# Patient Record
Sex: Female | Born: 1940
Health system: Southern US, Community
[De-identification: ages and names within clinical notes are randomized; demographics above are authoritative.]

## PROBLEM LIST (undated history)

## (undated) DIAGNOSIS — M5416 Radiculopathy, lumbar region: Secondary | ICD-10-CM

## (undated) DIAGNOSIS — E119 Type 2 diabetes mellitus without complications: Secondary | ICD-10-CM

## (undated) DIAGNOSIS — M8580 Other specified disorders of bone density and structure, unspecified site: Secondary | ICD-10-CM

## (undated) DIAGNOSIS — E785 Hyperlipidemia, unspecified: Secondary | ICD-10-CM

## (undated) DIAGNOSIS — I10 Essential (primary) hypertension: Secondary | ICD-10-CM

## (undated) DIAGNOSIS — M797 Fibromyalgia: Secondary | ICD-10-CM

## (undated) DIAGNOSIS — M1990 Unspecified osteoarthritis, unspecified site: Secondary | ICD-10-CM

## (undated) HISTORY — PX: APPENDECTOMY: SHX54

## (undated) HISTORY — DX: Essential (primary) hypertension: I10

## (undated) HISTORY — PX: TUBAL LIGATION: SHX77

## (undated) HISTORY — DX: Hyperlipidemia, unspecified: E78.5

## (undated) HISTORY — PX: HAND SURGERY: SHX662

## (undated) HISTORY — DX: Fibromyalgia: M79.7

## (undated) HISTORY — DX: Unspecified osteoarthritis, unspecified site: M19.90

## (undated) HISTORY — DX: Radiculopathy, lumbar region: M54.16

## (undated) HISTORY — DX: Other specified disorders of bone density and structure, unspecified site: M85.80

## (undated) HISTORY — PX: CATARACT EXTRACTION: SUR2

## (undated) HISTORY — PX: OTHER SURGICAL HISTORY: SHX169

## (undated) HISTORY — DX: Type 2 diabetes mellitus without complications: E11.9

## (undated) HISTORY — PX: BREAST ENHANCEMENT SURGERY: SHX7

## (undated) HISTORY — PX: PLANTAR FASCIA SURGERY: SHX746

---

## 2008-05-08 ENCOUNTER — Encounter: Admission: RE | Admit: 2008-05-08 | Discharge: 2008-08-06 | Payer: Self-pay | Admitting: Neurology

## 2008-05-10 ENCOUNTER — Encounter: Admission: RE | Admit: 2008-05-10 | Discharge: 2008-05-10 | Payer: Self-pay | Admitting: Neurology

## 2009-03-28 ENCOUNTER — Ambulatory Visit (HOSPITAL_BASED_OUTPATIENT_CLINIC_OR_DEPARTMENT_OTHER): Admission: RE | Admit: 2009-03-28 | Discharge: 2009-03-28 | Payer: Self-pay | Admitting: Orthopaedic Surgery

## 2009-09-15 IMAGING — CT CT ANGIO HEAD
1 of 10 series · 5 of 33 positions shown · IV contrast ([ID] [id number])
Comparison: None

CTA neck

CLINICAL DATA: Knee dizziness.  The worse when lying down.
Possible labyrinthitis.

CT ANGIOGRAPHY HEAD AND NECK
TECHNIQUE: Multidetector CT imaging of the head and neck was
performed using the standard protocol prior to and during bolus
administration of intravenous contrast. Multiplanar CT image
reconstructions including MIPs were obtained to evaluate the
vascular anatomy.  Carotid stenosis measurements (when applicable)
are obtained utilizing NASCET criteria, using the distal internal
carotid diameter as the denominator.
Contrast: 100 ml [id number]-P33

[Series 404: axial · axial · 0.33mm/px · z∈[-187,+40]mm · 5 of 343 slices shown]
[im 58/343  soft-tissue]
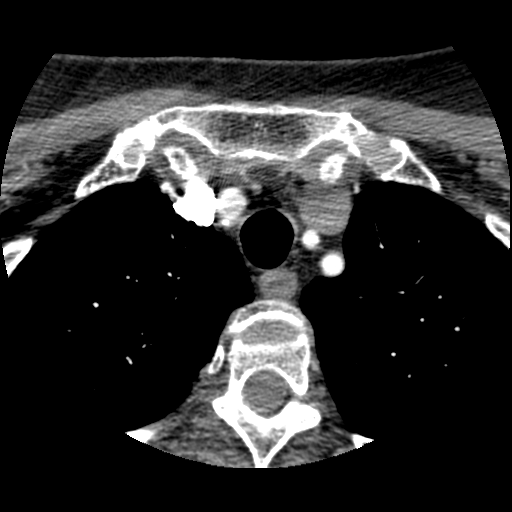
[im 115/343  bone]
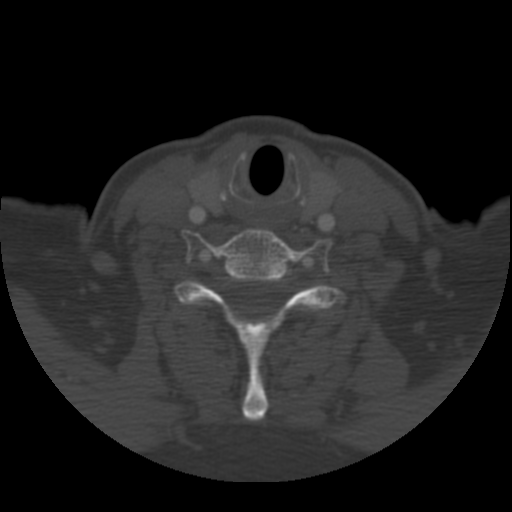
[im 172/343  soft-tissue]
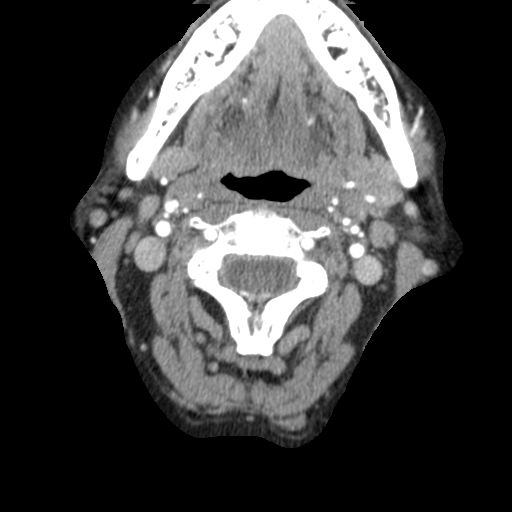
[im 229/343  bone]
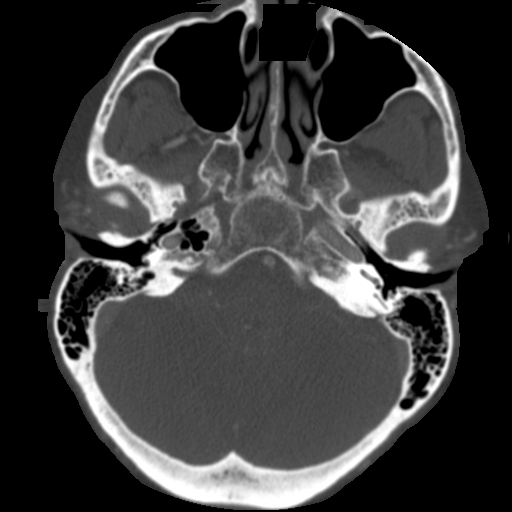
[im 286/343  soft-tissue]
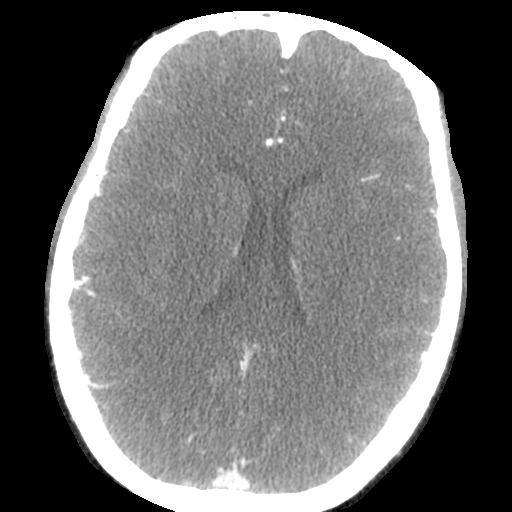

[5 of 33 positions shown; findings below may reference images not displayed]

FINDINGS: Lung apices are clear.  No superior mediastinal
pathology.

There is atherosclerosis of the aorta with calcification.  No
origin stenoses.  Innominate and left subclavian arteries appear
normal proximally.  There is atherosclerotic plaquing of the
proximal left subclavian artery which narrows the lumen from 11 mm
to 6 mm.  This is consistent with a diameter stenosis of 40-50%.

The left vertebral artery is the dominant vertebral artery.  There
is mild atherosclerotic narrowing at its origin but no more than
20% diameter stenosis.  The vessel appears widely patent through
the neck beyond that.  The right vertebral artery is also a fairly
large vessel which appears widely patent.  The origin is difficult
to evaluate accurately because of artifact in that area from dens
venous contrast.  I do not suspect that there is origin stenosis on
this side.

The right common carotid arteries widely patent to the bifurcation.
There is minimal atherosclerotic plaque at the carotid bifurcation
but no narrowing or irregularity.

The left common carotid artery is widely patent to the bifurcation.
The bifurcation appears normal on this side.  Both cervical ICA is
appear normal.
IMPRESSION: 40-50% stenosis of the proximal left subclavian artery.

Approximate 20% narrowing of the left vertebral artery origin.  No
flow-limiting stenosis.  The vessel appears normal beyond that.  No
right vertebral pathology is identified, though the origin is
difficult to evaluate accurately because of artifact related to
dense venous contrast.

No carotid bifurcation disease on the left.  Minimal plaque at the
carotid bifurcation on the right without stenosis.

CTA head
FINDINGS: Both internal carotid arteries are widely patent into
the brain.  There is some atherosclerotic change in the carotid
siphon regions but no apparent stenoses.  Both anterior and middle
cerebral vessels appear normal without proximal stenosis, aneurysm
or vascular malformation.  More distal branch vessels appear
unremarkable.

Both vertebral arteries are patent to the basilar.  The distal left
vertebral artery is fenestrated but widely patent.  There is no
basilar stenosis.  Posterior circulation branch vessels appear
normal.
IMPRESSION: Ordinary atherosclerosis in the carotid siphon regions without
suspicion of flow-limiting stenosis.  No anterior circulation
pathology beyond that.

No significant posterior circulation finding.  Incidental
fenestrated left vertebral artery at the foramen magnum without
evidence of significant pathology.

## 2010-05-25 LAB — POCT I-STAT, CHEM 8
BUN: 20 mg/dL (ref 6–23)
Creatinine, Ser: 0.8 mg/dL (ref 0.4–1.2)
Hemoglobin: 13.3 g/dL (ref 12.0–15.0)
Potassium: 3.6 mEq/L (ref 3.5–5.1)
Sodium: 142 mEq/L (ref 135–145)

## 2013-04-18 DIAGNOSIS — M546 Pain in thoracic spine: Secondary | ICD-10-CM

## 2013-04-18 DIAGNOSIS — M47814 Spondylosis without myelopathy or radiculopathy, thoracic region: Secondary | ICD-10-CM

## 2013-04-18 DIAGNOSIS — M797 Fibromyalgia: Secondary | ICD-10-CM | POA: Insufficient documentation

## 2013-04-18 DIAGNOSIS — M5414 Radiculopathy, thoracic region: Secondary | ICD-10-CM | POA: Insufficient documentation

## 2013-04-18 DIAGNOSIS — G543 Thoracic root disorders, not elsewhere classified: Secondary | ICD-10-CM | POA: Insufficient documentation

## 2013-04-18 DIAGNOSIS — M5417 Radiculopathy, lumbosacral region: Secondary | ICD-10-CM

## 2013-04-18 DIAGNOSIS — M1990 Unspecified osteoarthritis, unspecified site: Secondary | ICD-10-CM | POA: Insufficient documentation

## 2013-04-18 DIAGNOSIS — M791 Myalgia, unspecified site: Secondary | ICD-10-CM | POA: Insufficient documentation

## 2013-04-18 HISTORY — DX: Pain in thoracic spine: M54.6

## 2013-04-18 HISTORY — DX: Thoracic root disorders, not elsewhere classified: G54.3

## 2013-04-18 HISTORY — DX: Spondylosis without myelopathy or radiculopathy, thoracic region: M47.814

## 2013-04-18 HISTORY — DX: Myalgia, unspecified site: M79.10

## 2013-04-18 HISTORY — DX: Radiculopathy, thoracic region: M54.14

## 2014-12-24 ENCOUNTER — Encounter: Payer: Self-pay | Admitting: Podiatry

## 2014-12-24 ENCOUNTER — Ambulatory Visit (INDEPENDENT_AMBULATORY_CARE_PROVIDER_SITE_OTHER): Payer: Medicare Other | Admitting: Podiatry

## 2014-12-24 ENCOUNTER — Ambulatory Visit (INDEPENDENT_AMBULATORY_CARE_PROVIDER_SITE_OTHER): Payer: Medicare Other

## 2014-12-24 DIAGNOSIS — M2020 Hallux rigidus, unspecified foot: Secondary | ICD-10-CM

## 2014-12-24 DIAGNOSIS — R52 Pain, unspecified: Secondary | ICD-10-CM

## 2014-12-24 DIAGNOSIS — M205X9 Other deformities of toe(s) (acquired), unspecified foot: Secondary | ICD-10-CM | POA: Diagnosis not present

## 2014-12-24 DIAGNOSIS — M722 Plantar fascial fibromatosis: Secondary | ICD-10-CM | POA: Diagnosis not present

## 2014-12-24 NOTE — Progress Notes (Signed)
   Subjective:    Patient ID: Stephanie Macdonald, female    DOB: 1940-06-09, 74 y.o.   MRN: [id number]  HPIThis patient presents to the office with right foot pain in her heel.  She says she was seen in [place] when the pain started but it has become much more painful.  She says she was away this weekend and she experienced severe pain upon rising all weekend.  She has good shoes with shoe sockliner.  She has no history of trauma or injury.    Patient presents here today with right heel pain since started again 2-3 weeks ago.  Review of Systems  All other systems reviewed and are negative.      Objective:   Physical Exam GENERAL APPEARANCE: Alert, conversant. Appropriately groomed. No acute distress.  VASCULAR: Pedal pulses palpable at  [employer] and PT bilateral.  Capillary refill time is immediate to all digits,  Normal temperature gradient.  Digital hair growth is present bilateral  NEUROLOGIC: sensation is normal to 5.07 monofilament at 5/5 sites bilateral.  Light touch is intact bilateral, Muscle strength normal.  MUSCULOSKELETAL: acceptable muscle strength, tone and stability bilateral.  Intrinsic muscluature intact bilateral.  Rectus appearance of foot and digits noted bilateral. Palpable pain at the insertion plantar fascia that extends through arch right foot.  There is hallux limitus 1st MPJ B/L.  DERMATOLOGIC: skin color, texture, and turgor are within normal limits.  No preulcerative lesions or ulcers  are seen, no interdigital maceration noted.  No open lesions present.  Digital nails are asymptomatic. No drainage noted.         Assessment & Plan:  Plantar fascitis right foot  Hallux limitus B/l  ROV injection therapy purestrides  RTC prn.

## 2015-03-18 ENCOUNTER — Ambulatory Visit: Payer: Medicare Other | Admitting: Podiatry

## 2015-03-27 ENCOUNTER — Ambulatory Visit (INDEPENDENT_AMBULATORY_CARE_PROVIDER_SITE_OTHER): Payer: Medicare Other | Admitting: Sports Medicine

## 2015-03-27 ENCOUNTER — Encounter: Payer: Self-pay | Admitting: Sports Medicine

## 2015-03-27 DIAGNOSIS — M7661 Achilles tendinitis, right leg: Secondary | ICD-10-CM

## 2015-03-27 DIAGNOSIS — M545 Low back pain, unspecified: Secondary | ICD-10-CM

## 2015-03-27 DIAGNOSIS — M205X9 Other deformities of toe(s) (acquired), unspecified foot: Secondary | ICD-10-CM

## 2015-03-27 DIAGNOSIS — M79673 Pain in unspecified foot: Secondary | ICD-10-CM | POA: Diagnosis not present

## 2015-03-27 DIAGNOSIS — M722 Plantar fascial fibromatosis: Secondary | ICD-10-CM

## 2015-03-27 DIAGNOSIS — M2020 Hallux rigidus, unspecified foot: Secondary | ICD-10-CM

## 2015-03-27 NOTE — Progress Notes (Signed)
Patient ID: Stephanie Macdonald, female   DOB: March 07, 1941, 74 y.o.   MRN: [id number] Subjective: Stephanie Macdonald is a 75 y.o. female patient returns to office with complaint of heel pain on the right. Patient admits to post static dyskinesia for several months in duration. Patient has treated this problem with 1 injection by Dr. Prudence Davidson, inserts, changing shoes, and occasional stretching with no relief in symptoms. Patient reports that this treatment has not worked. Admits that she is also getting pain at back of the heel that is becoming more bothersome. Patient denies any other pedal complaints at this time.   There are no active problems to display for this patient.  Current Outpatient Prescriptions on File Prior to Visit  Medication Sig Dispense Refill  . Calcium Carb-Cholecalciferol (CALTRATE 600+D3 SOFT) 600-800 MG-UNIT CHEW once daily.    Marland Kitchen gabapentin (NEURONTIN) 600 MG tablet TK 1 AND 1/2 TS PO TID  0  . losartan (COZAAR) 100 MG tablet 100 mg.    . meloxicam (MOBIC) 15 MG tablet TK 1 T PO QAM  2  . Olopatadine HCl (PATADAY) 0.2 % SOLN Apply to eye.    . polyethylene glycol (MIRALAX / GLYCOLAX) packet daily as needed.    . psyllium (TGT PSYLLIUM FIBER) 0.52 G capsule 28.3 %.    . rosuvastatin (CRESTOR) 5 MG tablet 5 mg.     No current facility-administered medications on file prior to visit.   Allergies  Allergen Reactions  . Codeine     Other reaction(s): VOMITING  . Cymbalta [Duloxetine Hcl]   . Duloxetine   . Hydrocodone-Acetaminophen   . Metaxalone   . Other   . Tramadol   . Venlafaxine     Objective: Physical Exam General: The patient is alert and oriented x3 in no acute distress.  Dermatology: Skin is warm, dry and supple bilateral lower extremities. Nails 1-10 are normal. There is no erythema, edema, no eccymosis, no open lesions present. Integument is otherwise unremarkable.  Vascular: Dorsalis Pedis pulse 2/4 and Posterior Tibial pulse are 1/4 bilateral. Capillary fill  time is immediate to all digits.  Neurological: Grossly intact to light touch with an achilles reflex of +2/5 and a  negative Tinel's sign bilateral.  Musculoskeletal: Tenderness to palpation at the medial calcaneal tubercale and through the insertion of the plantar fascia on the right>left foot and at the insertion of the achilles on right. There is no gap or dell at achilles, feel intact. No pain with compression of calcaneus bilateral. No pain with tuning fork to calcaneus bilateral. No pain with calf compression bilateral. There is mild decreased Ankle joint and 1st MTPJs range of motion bilateral. All other joints range of motion within normal limits bilateral. Strength 5/5 in all groups bilateral.   Assessment and Plan: Problem List Items Addressed This Visit    None    Visit Diagnoses    Plantar fasciitis of right foot    -  Primary    Tendonitis, Achilles, right        Foot pain, unspecified laterality        Hallux limitus, acquired, unspecified laterality        Back pain at L4-L5 level          -Complete examination performed. Discussed with patient in detail the condition of achilles tendonitis and plantar fasciitis, how this occurs and general treatment options. Explained both conservative and surgical treatments.  -Declined repeat injection today -Patient has tried oral meds and did not help; does not  want to go back on another course -Recommended good supportive shoes and advised use of OTC insert. -Explained in detail the use of the night splint which was dispensed at today's visit. -Explained and dispensed to patient daily stretching exercises. Rx sent for PT at [facility] with treatment modalities.  -Recommend patient to ice affected area 1-2x daily. Ice pack dispensed.  -Patient to return to office in 4 weeks for follow up or sooner if problems or questions arise.  Landis Martins, DPM

## 2015-04-24 ENCOUNTER — Ambulatory Visit (INDEPENDENT_AMBULATORY_CARE_PROVIDER_SITE_OTHER): Payer: Medicare Other | Admitting: Sports Medicine

## 2015-04-24 ENCOUNTER — Encounter: Payer: Self-pay | Admitting: Sports Medicine

## 2015-04-24 DIAGNOSIS — M7661 Achilles tendinitis, right leg: Secondary | ICD-10-CM | POA: Diagnosis not present

## 2015-04-24 DIAGNOSIS — M779 Enthesopathy, unspecified: Secondary | ICD-10-CM

## 2015-04-24 DIAGNOSIS — M79673 Pain in unspecified foot: Secondary | ICD-10-CM

## 2015-04-24 DIAGNOSIS — M722 Plantar fascial fibromatosis: Secondary | ICD-10-CM | POA: Diagnosis not present

## 2015-04-24 NOTE — Progress Notes (Signed)
Patient ID: Stephanie Macdonald, female   DOB: 04/30/1940, 75 y.o.   MRN: [id number]  Subjective: Stephanie Macdonald is a 75 y.o. female patient returns to office for follow up eval of heel pain on the right and now new pain across top of right ankle started about 1 week ago with PT; feels like her ankle is "sprained". Patient states that she feels a little better at the heel area but its not back to her being able to walk comfortable like she wants to. Patient denies any other pedal complaints at this time.   There are no active problems to display for this patient.  Current Outpatient Prescriptions on File Prior to Visit  Medication Sig Dispense Refill  . Calcium Carb-Cholecalciferol (CALTRATE 600+D3 SOFT) 600-800 MG-UNIT CHEW once daily.    Marland Kitchen gabapentin (NEURONTIN) 600 MG tablet TK 1 AND 1/2 TS PO TID  0  . losartan (COZAAR) 100 MG tablet 100 mg.    . meloxicam (MOBIC) 15 MG tablet TK 1 T PO QAM  2  . Olopatadine HCl (PATADAY) 0.2 % SOLN Apply to eye.    . polyethylene glycol (MIRALAX / GLYCOLAX) packet daily as needed.    . psyllium (TGT PSYLLIUM FIBER) 0.52 G capsule 28.3 %.    . rosuvastatin (CRESTOR) 5 MG tablet 5 mg.     No current facility-administered medications on file prior to visit.   Allergies  Allergen Reactions  . Codeine     Other reaction(s): VOMITING  . Cymbalta [Duloxetine Hcl]   . Duloxetine   . Hydrocodone-Acetaminophen   . Metaxalone   . Other   . Tramadol   . Venlafaxine     Objective: Physical Exam General: The patient is alert and oriented x3 in no acute distress.  Dermatology: Skin is warm, dry and supple bilateral lower extremities. Nails 1-10 are normal. There is no erythema, edema, no eccymosis, no open lesions present. Integument is otherwise unremarkable.  Vascular: Dorsalis Pedis pulse 2/4 and Posterior Tibial pulse are 1/4 bilateral. Capillary fill time is immediate to all digits.  Neurological: Grossly intact to light touch with an achilles reflex  of +2/5 and a  negative Tinel's sign bilateral.  Musculoskeletal: Tenderness to palpation at the medial calcaneal tubercale and through the insertion of the plantar fascia on the right>left foot and at the insertion of the achilles on right. There is also mild pain to anterior ankle on right with focal soft tissue swelling. There is no gap or dell at anterior ankle or achilles, feels intact. No pain with compression of calcaneus bilateral. No pain with tuning fork to calcaneus bilateral. No pain with calf compression bilateral. There is mild decreased Ankle joint and 1st MTPJs range of motion bilateral. All other joints range of motion within normal limits bilateral. Strength 5/5 in all groups bilateral.   Assessment and Plan: Problem List Items Addressed This Visit    None    Visit Diagnoses    Plantar fasciitis of right foot    -  Primary    Tendonitis, Achilles, right        Tendonitis        Anterior ankle on right, likely compensation from PT    Foot pain, unspecified laterality          -Complete examination performed. Discussed with patient in detail the condition of achilles tendonitis, plantar fasciitis, and new anterior ankle pain which is likely compensation secondary to increased mobility with PT. -Declined repeat injection today -Patient has tried oral  meds and did not help; does not want to go back on another course at this time -Recommended good supportive shoes and advised use of OTC insert. Patient states that she stopped using inserts because they made her feet feel worse.  -Rx Fascial brace for right foot an instructed on use -Continue with night splint with modification of using 1 hour before bed -Continue with daily stretching exercises and PT at [facility] with treatment modalities.  -Recommend patient to ice affected areas 1-2x daily. Ice pack dispensed.  -Patient to return to office in 3-4 weeks for follow up or sooner if problems or questions arise.  Landis Martins,  DPM

## 2015-05-15 ENCOUNTER — Encounter: Payer: Self-pay | Admitting: Sports Medicine

## 2015-05-15 ENCOUNTER — Ambulatory Visit (INDEPENDENT_AMBULATORY_CARE_PROVIDER_SITE_OTHER): Payer: Medicare Other | Admitting: Sports Medicine

## 2015-05-15 DIAGNOSIS — M7661 Achilles tendinitis, right leg: Secondary | ICD-10-CM

## 2015-05-15 DIAGNOSIS — M779 Enthesopathy, unspecified: Secondary | ICD-10-CM

## 2015-05-15 DIAGNOSIS — M79673 Pain in unspecified foot: Secondary | ICD-10-CM

## 2015-05-15 DIAGNOSIS — M722 Plantar fascial fibromatosis: Secondary | ICD-10-CM | POA: Diagnosis not present

## 2015-05-15 MED ORDER — TRIAMCINOLONE ACETONIDE 10 MG/ML IJ SUSP: 10.0000 mg | Freq: Once | INTRAMUSCULAR | Status: AC

## 2015-05-15 NOTE — Progress Notes (Signed)
Patient ID: Stephanie Macdonald, female   DOB: 1940/03/12, 75 y.o.   MRN: [id number]  Subjective: Stephanie Macdonald is a 75 y.o. female patient returns to office for follow up eval of heel pain on the right states that pain is better to touch on right heel but still has pain with first standing 4-5/10 sometimes but today 2-3/10. States that pain on top of her ankle is better and pain at the back of her heel is fine; she only has pain now on bottom. Patient denies any other pedal complaints at this time.   There are no active problems to display for this patient.  Current Outpatient Prescriptions on File Prior to Visit  Medication Sig Dispense Refill  . Calcium Carb-Cholecalciferol (CALTRATE 600+D3 SOFT) 600-800 MG-UNIT CHEW once daily.    Marland Kitchen gabapentin (NEURONTIN) 600 MG tablet TK 1 AND 1/2 TS PO TID  0  . losartan (COZAAR) 100 MG tablet 100 mg.    . meloxicam (MOBIC) 15 MG tablet TK 1 T PO QAM  2  . Olopatadine HCl (PATADAY) 0.2 % SOLN Apply to eye.    . polyethylene glycol (MIRALAX / GLYCOLAX) packet daily as needed.    . psyllium (TGT PSYLLIUM FIBER) 0.52 G capsule 28.3 %.    . rosuvastatin (CRESTOR) 5 MG tablet 5 mg.     No current facility-administered medications on file prior to visit.   Allergies  Allergen Reactions  . Codeine     Other reaction(s): VOMITING  . Cymbalta [Duloxetine Hcl]   . Duloxetine   . Hydrocodone-Acetaminophen   . Metaxalone   . Other   . Tramadol   . Venlafaxine     Objective: Physical Exam General: The patient is alert and oriented x3 in no acute distress.  Dermatology: Skin is warm, dry and supple bilateral lower extremities. Nails 1-10 are normal. There is no erythema, edema, no eccymosis, no open lesions present. Integument is otherwise unremarkable.  Vascular: Dorsalis Pedis pulse 2/4 and Posterior Tibial pulse are 1/4 bilateral. Capillary fill time is immediate to all digits.  Neurological: Grossly intact to light touch with an achilles reflex of  +2/5 and a  negative Tinel's sign bilateral.  Musculoskeletal: Tenderness to palpation at the medial calcaneal tubercale and through the insertion of the plantar fascia on the rightfoot. No pain at the insertion of the achilles on right. There is decreased pain to anterior ankle on right There is no instability, gap or dell at anterior ankle or achilles, feels intact. No pain with compression of calcaneus bilateral. No pain with tuning fork to calcaneus bilateral. No pain with calf compression bilateral. There is mild decreased Ankle joint and 1st MTPJs range of motion bilateral. All other joints range of motion within normal limits bilateral. Strength 5/5 in all groups bilateral.   Assessment and Plan: Problem List Items Addressed This Visit    None    Visit Diagnoses    Plantar fasciitis of right foot    -  Primary    Relevant Medications    triamcinolone acetonide (KENALOG) 10 MG/ML injection 10 mg    Tendonitis, Achilles, right        Resolved    Tendonitis        Resolved    Foot pain, unspecified laterality          -Complete examination performed. Discussed with patient in detail the condition of continued plantar fasciitis - I encouraged patient to consider repeat injection to the plantar fascial area of which patient  elected to after in depth discussion of the benefits of steroid to help with allowing the inflammation to diminish and help the fascia to heal at this area. After oral consent and aseptic prep, injected a mixture containing 1 ml of 2%  plain lidocaine, 1 ml 0.5% plain marcaine, 0.5 ml of kenalog 10 and 0.5 ml of dexamethasone phosphate into right heel. Post-injection care discussed with patient.  -Patient has tried oral meds and state they did not help; does not want to go back on another course at this time -Recommended good supportive shoes and advised use of OTC insert. Patient states that she stopped using inserts because they made her feet feel worse and is only  using heel cups -Cont with Fascial brace for right foot as instructed;  Patient did not have a fascial brace today I am convinced that patient is not using all treatment modalities entirely -Continue with night splint with modification of using 1 hour before bed -Patient has completed PT with functional improvement as noted on PT notes -Recommend patient to ice affected areas 1-2x daily.  -Patient to return to office in 2 weeks for follow up or sooner if problems or questions arise.  Will recommend MRI if patient is not better at next visit for further evaluation.   Landis Martins, DPM

## 2015-05-29 ENCOUNTER — Ambulatory Visit: Payer: Medicare Other | Admitting: Sports Medicine

## 2015-05-29 ENCOUNTER — Encounter: Payer: Self-pay | Admitting: Sports Medicine

## 2015-05-29 ENCOUNTER — Ambulatory Visit (INDEPENDENT_AMBULATORY_CARE_PROVIDER_SITE_OTHER): Payer: Medicare Other | Admitting: Sports Medicine

## 2015-05-29 DIAGNOSIS — M779 Enthesopathy, unspecified: Secondary | ICD-10-CM | POA: Diagnosis not present

## 2015-05-29 DIAGNOSIS — M722 Plantar fascial fibromatosis: Secondary | ICD-10-CM

## 2015-05-29 DIAGNOSIS — M79671 Pain in right foot: Secondary | ICD-10-CM

## 2015-05-29 NOTE — Progress Notes (Signed)
Patient ID: Stephanie Macdonald, female   DOB: Feb 24, 1941, 75 y.o.   MRN: [id number]  Subjective: Stephanie Macdonald is a 75 y.o. female patient returns to office for follow up eval of heel pain on the right states that pain is better to touch on right heel but still has pain with first standing, after the injection the pain was completely gone for about 1 weeks but then slowly started to have pain again with first steps out of bed. Admits that she hasn't be wearing fascial brace because she thought it was for the pain that was at the top of her ankle. Patient denies any other pedal complaints at this time.   Patient Active Problem List   Diagnosis Date Noted  . Arthritis 04/18/2013  . Fibromyalgia 04/18/2013  . Muscle ache 04/18/2013  . Pain in thoracic spine 04/18/2013  . 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 Lesion, thoracic root 04/18/2013  . Degenerative arthritis of thoracic spine 04/18/2013   Current Outpatient Prescriptions on File Prior to Visit  Medication Sig Dispense Refill  . Calcium Carb-Cholecalciferol (CALTRATE 600+D3 SOFT) 600-800 MG-UNIT CHEW once daily.    Marland Kitchen gabapentin (NEURONTIN) 600 MG tablet TK 1 AND 1/2 TS PO TID  0  . losartan (COZAAR) 100 MG tablet 100 mg.    . meloxicam (MOBIC) 15 MG tablet TK 1 T PO QAM  2  . Olopatadine HCl (PATADAY) 0.2 % SOLN Apply to eye.    . polyethylene glycol (MIRALAX / GLYCOLAX) packet daily as needed.    . psyllium (TGT PSYLLIUM FIBER) 0.52 G capsule 28.3 %.    . rosuvastatin (CRESTOR) 5 MG tablet 5 mg.     Current Facility-Administered Medications on File Prior to Visit  Medication Dose Route Frequency Provider Last Rate Last Dose  . triamcinolone acetonide (KENALOG) 10 MG/ML injection 10 mg  10 mg Other Once Landis Martins, DPM       Allergies  Allergen Reactions  . Codeine     Other reaction(s): VOMITING  . Cymbalta [Duloxetine Hcl]   . Duloxetine   . Hydrocodone-Acetaminophen   . Metaxalone   . Other   . Tramadol   .  Venlafaxine     Objective: Physical Exam General: The patient is alert and oriented x3 in no acute distress.  Dermatology: Skin is warm, dry and supple bilateral lower extremities. Nails 1-10 are normal. There is no erythema, edema, no eccymosis, no open lesions present. Integument is otherwise unremarkable.  Vascular: Dorsalis Pedis pulse 2/4 and Posterior Tibial pulse are 1/4 bilateral. Capillary fill time is immediate to all digits.  Neurological: Grossly intact to light touch with an achilles reflex of +2/5 and a  negative Tinel's sign bilateral.  Musculoskeletal: No Tenderness to palpation at the medial calcaneal tubercale and through the insertion of the plantar fascia on the rightfoot. No pain at the insertion of the achilles on right. There is no pain to anterior ankle on right There is no instability, gap or dell at anterior ankle or achilles, feels intact. No pain with compression of calcaneus bilateral. No pain with tuning fork to calcaneus bilateral. No pain with calf compression bilateral. Subjective pain at plantar heel with standing after period of sitting in the morning only. There is mild decreased Ankle joint and 1st MTPJs range of motion bilateral. All other joints range of motion within normal limits bilateral. Strength 5/5 in all groups bilateral.   Assessment and Plan: Problem List Items Addressed This Visit    None  Visit Diagnoses    Plantar fasciitis of right foot    -  Primary    Tendonitis        Right foot pain          -Complete examination performed. Discussed with patient in detail the condition of continued plantar fasciitis -Patient does not want another injection at this time.  -Patient has tried oral meds and state they did not help; does not want to go back on another course at this time -Recommended good supportive shoes and advised use of OTC inserts/heel cups -Cont with Fascial brace for right foot as instructed;  Patient did not have a fascial  brace today I am convinced that patient is not using all treatment modalities entirely; Educated patient on need to return to using brace as instructed -Continue with night splint with modification of using 1 hour before bed -Recommend patient to ice affected areas 1-2x daily.  -Patient to return to office in 2 weeks for follow up or sooner if problems or questions arise.  Will recommend MRI vs EPAT if patient is not better at next visit.  Landis Martins, DPM

## 2015-06-12 ENCOUNTER — Telehealth: Payer: Self-pay | Admitting: *Deleted

## 2015-06-12 ENCOUNTER — Encounter: Payer: Self-pay | Admitting: Sports Medicine

## 2015-06-12 ENCOUNTER — Ambulatory Visit (INDEPENDENT_AMBULATORY_CARE_PROVIDER_SITE_OTHER): Payer: Medicare Other | Admitting: Sports Medicine

## 2015-06-12 DIAGNOSIS — M79671 Pain in right foot: Secondary | ICD-10-CM

## 2015-06-12 DIAGNOSIS — M722 Plantar fascial fibromatosis: Secondary | ICD-10-CM | POA: Diagnosis not present

## 2015-06-12 DIAGNOSIS — M779 Enthesopathy, unspecified: Secondary | ICD-10-CM | POA: Diagnosis not present

## 2015-06-12 NOTE — Progress Notes (Signed)
Patient ID: Stephanie Macdonald, female   DOB: January 18, 1941, 75 y.o.   MRN: [id number]  Subjective: Stephanie Macdonald is a 75 y.o. female patient returns to office for follow up eval of heel pain on the right states that pain is better to touch on right heel but still has pain with first standing,states that she has been wearing fascial brace and cannot tell a difference of its helping.. Reports that she has been compliant with good shoes and icing. States that she has a little bit of pain at the top of the ankle and back of the right heel. Patient admits that overall pain is better from when she initially presented. However, these areas are still tender at times. Patient denies any other pedal complaints at this time.   Patient Active Problem List   Diagnosis Date Noted  . Arthritis 04/18/2013  . Fibromyalgia 04/18/2013  . Muscle ache 04/18/2013  . Pain in thoracic spine 04/18/2013  . 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 Lesion, thoracic root 04/18/2013  . Degenerative arthritis of thoracic spine 04/18/2013   Current Outpatient Prescriptions on File Prior to Visit  Medication Sig Dispense Refill  . Calcium Carb-Cholecalciferol (CALTRATE 600+D3 SOFT) 600-800 MG-UNIT CHEW once daily.    Marland Kitchen gabapentin (NEURONTIN) 600 MG tablet TK 1 AND 1/2 TS PO TID  0  . losartan (COZAAR) 100 MG tablet 100 mg.    . meloxicam (MOBIC) 15 MG tablet TK 1 T PO QAM  2  . Olopatadine HCl (PATADAY) 0.2 % SOLN Apply to eye.    . polyethylene glycol (MIRALAX / GLYCOLAX) packet daily as needed.    . psyllium (TGT PSYLLIUM FIBER) 0.52 G capsule 28.3 %.    . rosuvastatin (CRESTOR) 5 MG tablet 5 mg.     Current Facility-Administered Medications on File Prior to Visit  Medication Dose Route Frequency Provider Last Rate Last Dose  . triamcinolone acetonide (KENALOG) 10 MG/ML injection 10 mg  10 mg Other Once Landis Martins, DPM       Allergies  Allergen Reactions  . Codeine     Other reaction(s): VOMITING  .  Cymbalta [Duloxetine Hcl]   . Duloxetine   . Hydrocodone-Acetaminophen   . Metaxalone   . Other   . Tramadol   . Venlafaxine     Objective: Physical Exam General: The patient is alert and oriented x3 in no acute distress.  Dermatology: Skin is warm, dry and supple bilateral lower extremities. Nails 1-10 are normal. There is no erythema, edema, no eccymosis, no open lesions present. Integument is otherwise unremarkable.  Vascular: Dorsalis Pedis pulse 2/4 and Posterior Tibial pulse are 1/4 bilateral. Capillary fill time is immediate to all digits.  Neurological: Grossly intact to light touch with an achilles reflex of +2/5 and a  negative Tinel's sign bilateral.  Musculoskeletal: Mild Tenderness to palpation at the medial calcaneal tubercale and through the insertion of the plantar fascia on the right foot. Mild pain at the lateral insertion of the achilles on right. There is mild pain to anterior ankle on right over extensor tendons; likely compensation. There is no instability, gap or dell at anterior ankle or achilles, feels intact on right. No pain with compression of calcaneus bilateral. No pain with tuning fork to calcaneus bilateral. No pain with calf compression bilateral.  There is mild decreased Ankle joint and 1st MTPJs range of motion bilateral. All other joints range of motion within normal limits bilateral. Strength 5/5 in all groups bilateral.   Assessment and  Plan: Problem List Items Addressed This Visit    None    Visit Diagnoses    Plantar fasciitis of right foot    -  Primary    Relevant Orders    CBC with Differential    Basic Metabolic Panel    Sedimentation Rate    C-reactive protein    Rheumatoid factor    Uric Acid    HLA-B27 Antigen    ANA    Tendonitis        Relevant Orders    CBC with Differential    Basic Metabolic Panel    Sedimentation Rate    C-reactive protein    Rheumatoid factor    Uric Acid    HLA-B27 Antigen    ANA    Right foot pain         Relevant Orders    CBC with Differential    Basic Metabolic Panel    Sedimentation Rate    C-reactive protein    Rheumatoid factor    Uric Acid    HLA-B27 Antigen    ANA      -Complete examination performed. Discussed with patient in detail the condition of continued plantar fasciitis -Patient does not want another injection at this time.  -Patient has tried oral meds and state they did not help; does not want to go back on another course at this time -Recommended good supportive shoes and advised use of OTC inserts/heel cups -Cont with Fascial brace for right foot as instructed -Continue with night splint with modification of using 1 hour before bed -Ordered MRI to rule out partial tear and for further evaluation of tendinitis and fasciitis right foot in the setting of minimal improvement/resolution with conservative treatment -Ordered arthritic panel for further evaluation since patient showing minimal improvement to rule out any underlying inflammatory component -Recommend patient to continue to ice affected areas 1-2x daily.  -Patient to return to office after MRI or follow up or sooner if problems or questions arise.  Landis Martins, DPM

## 2015-06-12 NOTE — Telephone Encounter (Addendum)
-----   Message from [place], [place] sent at 06/12/2015 11:31 AM EDT ----- Regarding: MRI Please order MRI w/o contrast right foot and ankle r/o partial tear and evaluate plantar fascia, extensor tendons, and achilles tendon Thanks Dr. Cannon Kettle.  [employer] DOES NOT REQUIRE PRIOR AUTHORIZATION FOR MRI - [id number] AND [id number].  Faxed to [place]. 06/20/2015-Faxed request for MRI results to be sent to the [facility] office [phone] fax.

## 2015-06-13 ENCOUNTER — Telehealth: Payer: Self-pay | Admitting: Sports Medicine

## 2015-06-13 NOTE — Telephone Encounter (Signed)
Patient had lab work from her primary care doctor sent to me via fax from Dr. Hale Bogus office these labs were from 05/01/2015 consisting of a lipid panel CBC hemoglobin A1c and BMP , all which were  Within normal limits. I expressed to the patient that I am concerned for possible inflammatory component and that a arthritic panel will still need to be done. Advised patient to go to [facility] to get blood work drawn. Patient expressed understanding and states that she will try to go to [facility] tomorrow to get the blood work drawn. She also admits that she has been contacted about her MRI and is supposed to go to tomorrow for that. I advised patient that once MRI is completed to call office for follow-up appointment to discuss her MRI and her laboratory results -Dr. Cannon Kettle

## 2015-06-14 ENCOUNTER — Encounter: Payer: Self-pay | Admitting: Sports Medicine

## 2015-06-20 ENCOUNTER — Encounter: Payer: Self-pay | Admitting: Sports Medicine

## 2015-06-20 ENCOUNTER — Ambulatory Visit (INDEPENDENT_AMBULATORY_CARE_PROVIDER_SITE_OTHER): Payer: Medicare Other | Admitting: Sports Medicine

## 2015-06-20 DIAGNOSIS — D1800 Hemangioma unspecified site: Secondary | ICD-10-CM | POA: Diagnosis not present

## 2015-06-20 DIAGNOSIS — M79671 Pain in right foot: Secondary | ICD-10-CM

## 2015-06-20 DIAGNOSIS — M722 Plantar fascial fibromatosis: Secondary | ICD-10-CM

## 2015-06-20 NOTE — Progress Notes (Signed)
Patient ID: Stephanie Macdonald, female   DOB: 02-06-1941, 75 y.o.   MRN: [id number]  Subjective: Stephanie Macdonald is a 75 y.o. female patient returns to office for follow up eval of heel and ankle pain on the right states that the pain may be a little better than what it was 1 week ago. Reports that her pain is tolerable. However, sometimes with walking. She feels discomfort at the heel and discomfort when bending the ankle. Patient is here today for review of lab work and also for review of results from her MRI. Patient denies any other pedal complaints at this time.   Patient Active Problem List   Diagnosis Date Noted  . Arthritis 04/18/2013  . Fibromyalgia 04/18/2013  . Muscle ache 04/18/2013  . Pain in thoracic spine 04/18/2013  . 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 Lesion, thoracic root 04/18/2013  . Degenerative arthritis of thoracic spine 04/18/2013   Current Outpatient Prescriptions on File Prior to Visit  Medication Sig Dispense Refill  . Calcium Carb-Cholecalciferol (CALTRATE 600+D3 SOFT) 600-800 MG-UNIT CHEW once daily.    Marland Kitchen gabapentin (NEURONTIN) 600 MG tablet TK 1 AND 1/2 TS PO TID  0  . losartan (COZAAR) 100 MG tablet 100 mg.    . meloxicam (MOBIC) 15 MG tablet TK 1 T PO QAM  2  . Olopatadine HCl (PATADAY) 0.2 % SOLN Apply to eye.    . polyethylene glycol (MIRALAX / GLYCOLAX) packet daily as needed.    . psyllium (TGT PSYLLIUM FIBER) 0.52 G capsule 28.3 %.    . rosuvastatin (CRESTOR) 5 MG tablet 5 mg.     Current Facility-Administered Medications on File Prior to Visit  Medication Dose Route Frequency Provider Last Rate Last Dose  . triamcinolone acetonide (KENALOG) 10 MG/ML injection 10 mg  10 mg Other Once Landis Martins, DPM       Allergies  Allergen Reactions  . Codeine     Other reaction(s): VOMITING  . Cymbalta [Duloxetine Hcl]   . Duloxetine   . Hydrocodone-Acetaminophen   . Metaxalone   . Other   . Tramadol   . Venlafaxine      Objective: Physical Exam, grossly unchanged from prior  General: The patient is alert and oriented x3 in no acute distress.  Dermatology: Skin is warm, dry and supple bilateral lower extremities. Nails 1-10 are normal. There is no erythema, edema, no eccymosis, no open lesions present. Integument is otherwise unremarkable.  Vascular: Dorsalis Pedis pulse 2/4 and Posterior Tibial pulse are 1/4 bilateral. Capillary fill time is immediate to all digits.  Neurological: Grossly intact to light touch with an achilles reflex of +2/5 and a  negative Tinel's sign bilateral.  Musculoskeletal: Mild Tenderness to palpation at the medial calcaneal tubercale and through the insertion of the plantar fascia on the right foot. Mild pain at the lateral insertion of the achilles on right. There is mild pain to anterior ankle on right over extensor tendons; There is no instability, gap or dell at anterior ankle or achilles, feels intact on right. No pain with compression of calcaneus bilateral. No pain with tuning fork to calcaneus bilateral. No pain with calf compression bilateral.  There is mild decreased Ankle joint and 1st MTPJs range of motion bilateral. All other joints range of motion within normal limits bilateral. Strength 5/5 in all groups bilateral.   MRI right foot and ankle- suggestive of moderate plantar fasciitis and complex hemangiomas surrounding the extensor hallucis longus and tibialis anterior tendon.   Blood  work and arthritic panel within normal limits  Assessment and Plan: Problem List Items Addressed This Visit    None    Visit Diagnoses    Plantar fasciitis, right    -  Primary    Right foot pain        Hemangioma        Right foot and ankle      -Complete examination performed.  -Discussed with patient in detail the condition of continued plantar fasciitis and hemangioma right foot and ankle -Advised patient to consider EPAT for plantar fasciitis since she has made minimal  improvement with conservative therapies; informational brochure given -Recommend cont with good supportive shoes and advised use of OTC inserts/heel cups; patient may wean to other shoes as desired -Cont with Fascial brace for right foot with slow weaning as instructed -Continue with night splint with modification of using 1 hour before bed as tolerated -Recommend patient to continue to ice affected areas 1-2x daily as needed -Discussed in great detail hemangioma and strongly advised patient to have vascular consultation for concerning hemangioma that causes pain with dorsiflexion at right ankle. Advised patient that because of the vascular nature of the lesion, that a vascular surgeon with better take care of this. Advised patient to consider consult with [facility] surgical specialists with Dr. Jerel Shepherd; patient states that she would like to think about this and will give our office a phone call back once she has made her mind up about this consultation for [place] to send a copy of the chart note and MRI and laboratory records to this recommended consulting service  -Patient to return to office after vascular evaluation or follow up or sooner if problems or questions arise.  Landis Martins, DPM

## 2015-08-02 ENCOUNTER — Encounter: Payer: Self-pay | Admitting: Sports Medicine

## 2018-04-25 ENCOUNTER — Other Ambulatory Visit: Payer: Self-pay | Admitting: Internal Medicine

## 2018-04-25 DIAGNOSIS — M8580 Other specified disorders of bone density and structure, unspecified site: Secondary | ICD-10-CM

## 2018-06-22 ENCOUNTER — Other Ambulatory Visit: Payer: Self-pay

## 2018-08-29 ENCOUNTER — Ambulatory Visit
Admission: RE | Admit: 2018-08-29 | Discharge: 2018-08-29 | Disposition: A | Discharge status: Home / Self Care | Payer: Medicare Other | Source: Ambulatory Visit | Attending: Internal Medicine | Admitting: Internal Medicine

## 2018-08-29 DIAGNOSIS — M8580 Other specified disorders of bone density and structure, unspecified site: Secondary | ICD-10-CM

## 2019-05-02 DIAGNOSIS — M797 Fibromyalgia: Secondary | ICD-10-CM | POA: Diagnosis not present

## 2019-05-02 DIAGNOSIS — I10 Essential (primary) hypertension: Secondary | ICD-10-CM | POA: Diagnosis not present

## 2019-05-02 DIAGNOSIS — E663 Overweight: Secondary | ICD-10-CM | POA: Diagnosis not present

## 2019-05-02 DIAGNOSIS — Z79899 Other long term (current) drug therapy: Secondary | ICD-10-CM | POA: Diagnosis not present

## 2019-05-02 DIAGNOSIS — Z6827 Body mass index (BMI) 27.0-27.9, adult: Secondary | ICD-10-CM | POA: Diagnosis not present

## 2019-05-02 DIAGNOSIS — E785 Hyperlipidemia, unspecified: Secondary | ICD-10-CM | POA: Diagnosis not present

## 2019-05-02 DIAGNOSIS — Z131 Encounter for screening for diabetes mellitus: Secondary | ICD-10-CM | POA: Diagnosis not present

## 2019-05-02 DIAGNOSIS — M159 Polyosteoarthritis, unspecified: Secondary | ICD-10-CM | POA: Diagnosis not present

## 2019-05-24 DIAGNOSIS — N3281 Overactive bladder: Secondary | ICD-10-CM | POA: Diagnosis not present

## 2019-05-24 DIAGNOSIS — N3289 Other specified disorders of bladder: Secondary | ICD-10-CM | POA: Diagnosis not present

## 2019-06-06 DIAGNOSIS — H25811 Combined forms of age-related cataract, right eye: Secondary | ICD-10-CM | POA: Diagnosis not present

## 2019-06-20 DIAGNOSIS — E785 Hyperlipidemia, unspecified: Secondary | ICD-10-CM | POA: Diagnosis not present

## 2019-06-20 DIAGNOSIS — I10 Essential (primary) hypertension: Secondary | ICD-10-CM | POA: Diagnosis not present

## 2019-06-20 DIAGNOSIS — H52223 Regular astigmatism, bilateral: Secondary | ICD-10-CM | POA: Diagnosis not present

## 2019-06-20 DIAGNOSIS — H259 Unspecified age-related cataract: Secondary | ICD-10-CM | POA: Diagnosis not present

## 2019-06-20 DIAGNOSIS — H25811 Combined forms of age-related cataract, right eye: Secondary | ICD-10-CM | POA: Diagnosis not present

## 2019-06-20 DIAGNOSIS — Z79899 Other long term (current) drug therapy: Secondary | ICD-10-CM | POA: Diagnosis not present

## 2019-07-07 DIAGNOSIS — Z0000 Encounter for general adult medical examination without abnormal findings: Secondary | ICD-10-CM | POA: Diagnosis not present

## 2019-07-07 DIAGNOSIS — Z9181 History of falling: Secondary | ICD-10-CM | POA: Diagnosis not present

## 2019-07-07 DIAGNOSIS — Z1331 Encounter for screening for depression: Secondary | ICD-10-CM | POA: Diagnosis not present

## 2019-07-07 DIAGNOSIS — E785 Hyperlipidemia, unspecified: Secondary | ICD-10-CM | POA: Diagnosis not present

## 2019-07-18 DIAGNOSIS — H259 Unspecified age-related cataract: Secondary | ICD-10-CM | POA: Diagnosis not present

## 2019-07-18 DIAGNOSIS — H25812 Combined forms of age-related cataract, left eye: Secondary | ICD-10-CM | POA: Diagnosis not present

## 2019-07-18 DIAGNOSIS — H52223 Regular astigmatism, bilateral: Secondary | ICD-10-CM | POA: Diagnosis not present

## 2019-07-18 DIAGNOSIS — Z79899 Other long term (current) drug therapy: Secondary | ICD-10-CM | POA: Diagnosis not present

## 2019-07-18 DIAGNOSIS — I10 Essential (primary) hypertension: Secondary | ICD-10-CM | POA: Diagnosis not present

## 2019-07-18 DIAGNOSIS — K5909 Other constipation: Secondary | ICD-10-CM | POA: Diagnosis not present

## 2019-07-18 DIAGNOSIS — E785 Hyperlipidemia, unspecified: Secondary | ICD-10-CM | POA: Diagnosis not present

## 2019-08-30 DIAGNOSIS — E785 Hyperlipidemia, unspecified: Secondary | ICD-10-CM | POA: Diagnosis not present

## 2019-08-30 DIAGNOSIS — I10 Essential (primary) hypertension: Secondary | ICD-10-CM | POA: Diagnosis not present

## 2019-08-30 DIAGNOSIS — M797 Fibromyalgia: Secondary | ICD-10-CM | POA: Diagnosis not present

## 2019-08-30 DIAGNOSIS — Z131 Encounter for screening for diabetes mellitus: Secondary | ICD-10-CM | POA: Diagnosis not present

## 2019-08-30 DIAGNOSIS — Z139 Encounter for screening, unspecified: Secondary | ICD-10-CM | POA: Diagnosis not present

## 2019-08-30 DIAGNOSIS — Z6827 Body mass index (BMI) 27.0-27.9, adult: Secondary | ICD-10-CM | POA: Diagnosis not present

## 2019-08-30 DIAGNOSIS — M79673 Pain in unspecified foot: Secondary | ICD-10-CM | POA: Diagnosis not present

## 2019-08-30 DIAGNOSIS — M159 Polyosteoarthritis, unspecified: Secondary | ICD-10-CM | POA: Diagnosis not present

## 2019-08-30 DIAGNOSIS — M79676 Pain in unspecified toe(s): Secondary | ICD-10-CM | POA: Diagnosis not present

## 2019-10-10 DIAGNOSIS — N3281 Overactive bladder: Secondary | ICD-10-CM | POA: Diagnosis not present

## 2019-10-10 DIAGNOSIS — B373 Candidiasis of vulva and vagina: Secondary | ICD-10-CM | POA: Diagnosis not present

## 2019-10-10 DIAGNOSIS — N390 Urinary tract infection, site not specified: Secondary | ICD-10-CM | POA: Diagnosis not present

## 2019-10-24 DIAGNOSIS — L918 Other hypertrophic disorders of the skin: Secondary | ICD-10-CM | POA: Diagnosis not present

## 2019-10-24 DIAGNOSIS — L570 Actinic keratosis: Secondary | ICD-10-CM | POA: Diagnosis not present

## 2019-10-24 DIAGNOSIS — L821 Other seborrheic keratosis: Secondary | ICD-10-CM | POA: Diagnosis not present

## 2019-11-29 DIAGNOSIS — N3281 Overactive bladder: Secondary | ICD-10-CM | POA: Diagnosis not present

## 2019-11-29 DIAGNOSIS — N393 Stress incontinence (female) (male): Secondary | ICD-10-CM | POA: Diagnosis not present

## 2019-11-29 DIAGNOSIS — N952 Postmenopausal atrophic vaginitis: Secondary | ICD-10-CM | POA: Diagnosis not present

## 2019-12-26 DIAGNOSIS — Z79899 Other long term (current) drug therapy: Secondary | ICD-10-CM | POA: Diagnosis not present

## 2019-12-26 DIAGNOSIS — E785 Hyperlipidemia, unspecified: Secondary | ICD-10-CM | POA: Diagnosis not present

## 2019-12-26 DIAGNOSIS — M797 Fibromyalgia: Secondary | ICD-10-CM | POA: Diagnosis not present

## 2019-12-26 DIAGNOSIS — Z131 Encounter for screening for diabetes mellitus: Secondary | ICD-10-CM | POA: Diagnosis not present

## 2019-12-26 DIAGNOSIS — I10 Essential (primary) hypertension: Secondary | ICD-10-CM | POA: Diagnosis not present

## 2019-12-26 DIAGNOSIS — E663 Overweight: Secondary | ICD-10-CM | POA: Diagnosis not present

## 2019-12-26 DIAGNOSIS — Z6827 Body mass index (BMI) 27.0-27.9, adult: Secondary | ICD-10-CM | POA: Diagnosis not present

## 2020-01-16 DIAGNOSIS — N3281 Overactive bladder: Secondary | ICD-10-CM | POA: Diagnosis not present

## 2020-01-16 DIAGNOSIS — N393 Stress incontinence (female) (male): Secondary | ICD-10-CM | POA: Diagnosis not present

## 2020-01-16 DIAGNOSIS — N952 Postmenopausal atrophic vaginitis: Secondary | ICD-10-CM | POA: Diagnosis not present

## 2020-05-20 DIAGNOSIS — N390 Urinary tract infection, site not specified: Secondary | ICD-10-CM | POA: Diagnosis not present

## 2020-06-25 DIAGNOSIS — I10 Essential (primary) hypertension: Secondary | ICD-10-CM | POA: Diagnosis not present

## 2020-06-25 DIAGNOSIS — Z79899 Other long term (current) drug therapy: Secondary | ICD-10-CM | POA: Diagnosis not present

## 2020-06-25 DIAGNOSIS — E663 Overweight: Secondary | ICD-10-CM | POA: Diagnosis not present

## 2020-06-25 DIAGNOSIS — E785 Hyperlipidemia, unspecified: Secondary | ICD-10-CM | POA: Diagnosis not present

## 2020-06-25 DIAGNOSIS — M797 Fibromyalgia: Secondary | ICD-10-CM | POA: Diagnosis not present

## 2020-06-25 DIAGNOSIS — Z131 Encounter for screening for diabetes mellitus: Secondary | ICD-10-CM | POA: Diagnosis not present

## 2020-06-25 DIAGNOSIS — Z6826 Body mass index (BMI) 26.0-26.9, adult: Secondary | ICD-10-CM | POA: Diagnosis not present

## 2020-07-09 DIAGNOSIS — Z0000 Encounter for general adult medical examination without abnormal findings: Secondary | ICD-10-CM | POA: Diagnosis not present

## 2020-07-09 DIAGNOSIS — E785 Hyperlipidemia, unspecified: Secondary | ICD-10-CM | POA: Diagnosis not present

## 2020-07-09 DIAGNOSIS — Z9181 History of falling: Secondary | ICD-10-CM | POA: Diagnosis not present

## 2020-07-09 DIAGNOSIS — Z1331 Encounter for screening for depression: Secondary | ICD-10-CM | POA: Diagnosis not present

## 2020-07-18 DIAGNOSIS — N393 Stress incontinence (female) (male): Secondary | ICD-10-CM | POA: Diagnosis not present

## 2020-07-18 DIAGNOSIS — N3281 Overactive bladder: Secondary | ICD-10-CM | POA: Diagnosis not present

## 2020-07-18 DIAGNOSIS — N952 Postmenopausal atrophic vaginitis: Secondary | ICD-10-CM | POA: Diagnosis not present

## 2020-07-23 DIAGNOSIS — Z1231 Encounter for screening mammogram for malignant neoplasm of breast: Secondary | ICD-10-CM | POA: Diagnosis not present

## 2020-07-23 DIAGNOSIS — M8589 Other specified disorders of bone density and structure, multiple sites: Secondary | ICD-10-CM | POA: Diagnosis not present

## 2020-08-29 DIAGNOSIS — Z01419 Encounter for gynecological examination (general) (routine) without abnormal findings: Secondary | ICD-10-CM | POA: Diagnosis not present

## 2020-08-29 DIAGNOSIS — M8588 Other specified disorders of bone density and structure, other site: Secondary | ICD-10-CM | POA: Diagnosis not present

## 2020-10-07 DIAGNOSIS — M1990 Unspecified osteoarthritis, unspecified site: Secondary | ICD-10-CM | POA: Diagnosis not present

## 2020-10-07 DIAGNOSIS — N3281 Overactive bladder: Secondary | ICD-10-CM | POA: Diagnosis not present

## 2020-10-07 DIAGNOSIS — G629 Polyneuropathy, unspecified: Secondary | ICD-10-CM | POA: Diagnosis not present

## 2020-10-07 DIAGNOSIS — I10 Essential (primary) hypertension: Secondary | ICD-10-CM | POA: Diagnosis not present

## 2020-10-07 DIAGNOSIS — M810 Age-related osteoporosis without current pathological fracture: Secondary | ICD-10-CM | POA: Diagnosis not present

## 2020-10-07 DIAGNOSIS — E785 Hyperlipidemia, unspecified: Secondary | ICD-10-CM | POA: Diagnosis not present

## 2020-10-07 DIAGNOSIS — I951 Orthostatic hypotension: Secondary | ICD-10-CM | POA: Diagnosis not present

## 2020-10-07 DIAGNOSIS — H547 Unspecified visual loss: Secondary | ICD-10-CM | POA: Diagnosis not present

## 2020-10-07 DIAGNOSIS — G8929 Other chronic pain: Secondary | ICD-10-CM | POA: Diagnosis not present

## 2020-10-22 DIAGNOSIS — L820 Inflamed seborrheic keratosis: Secondary | ICD-10-CM | POA: Diagnosis not present

## 2020-10-22 DIAGNOSIS — C44712 Basal cell carcinoma of skin of right lower limb, including hip: Secondary | ICD-10-CM | POA: Diagnosis not present

## 2020-10-22 DIAGNOSIS — L570 Actinic keratosis: Secondary | ICD-10-CM | POA: Diagnosis not present

## 2020-10-22 DIAGNOSIS — L821 Other seborrheic keratosis: Secondary | ICD-10-CM | POA: Diagnosis not present

## 2020-10-22 DIAGNOSIS — L814 Other melanin hyperpigmentation: Secondary | ICD-10-CM | POA: Diagnosis not present

## 2020-12-16 DIAGNOSIS — M2042 Other hammer toe(s) (acquired), left foot: Secondary | ICD-10-CM | POA: Diagnosis not present

## 2020-12-16 DIAGNOSIS — M2041 Other hammer toe(s) (acquired), right foot: Secondary | ICD-10-CM | POA: Diagnosis not present

## 2020-12-16 DIAGNOSIS — M19071 Primary osteoarthritis, right ankle and foot: Secondary | ICD-10-CM | POA: Diagnosis not present

## 2020-12-16 DIAGNOSIS — M7672 Peroneal tendinitis, left leg: Secondary | ICD-10-CM | POA: Diagnosis not present

## 2020-12-16 DIAGNOSIS — L84 Corns and callosities: Secondary | ICD-10-CM | POA: Diagnosis not present

## 2020-12-16 DIAGNOSIS — M19072 Primary osteoarthritis, left ankle and foot: Secondary | ICD-10-CM | POA: Diagnosis not present

## 2020-12-31 DIAGNOSIS — I10 Essential (primary) hypertension: Secondary | ICD-10-CM | POA: Diagnosis not present

## 2020-12-31 DIAGNOSIS — Z139 Encounter for screening, unspecified: Secondary | ICD-10-CM | POA: Diagnosis not present

## 2020-12-31 DIAGNOSIS — H8110 Benign paroxysmal vertigo, unspecified ear: Secondary | ICD-10-CM | POA: Diagnosis not present

## 2020-12-31 DIAGNOSIS — M797 Fibromyalgia: Secondary | ICD-10-CM | POA: Diagnosis not present

## 2020-12-31 DIAGNOSIS — Z79899 Other long term (current) drug therapy: Secondary | ICD-10-CM | POA: Diagnosis not present

## 2020-12-31 DIAGNOSIS — E785 Hyperlipidemia, unspecified: Secondary | ICD-10-CM | POA: Diagnosis not present

## 2020-12-31 DIAGNOSIS — Z6824 Body mass index (BMI) 24.0-24.9, adult: Secondary | ICD-10-CM | POA: Diagnosis not present

## 2021-01-16 DIAGNOSIS — M7672 Peroneal tendinitis, left leg: Secondary | ICD-10-CM | POA: Diagnosis not present

## 2021-01-16 DIAGNOSIS — M19071 Primary osteoarthritis, right ankle and foot: Secondary | ICD-10-CM | POA: Diagnosis not present

## 2021-01-16 DIAGNOSIS — M19072 Primary osteoarthritis, left ankle and foot: Secondary | ICD-10-CM | POA: Diagnosis not present

## 2021-01-21 DIAGNOSIS — N952 Postmenopausal atrophic vaginitis: Secondary | ICD-10-CM | POA: Diagnosis not present

## 2021-01-21 DIAGNOSIS — N3281 Overactive bladder: Secondary | ICD-10-CM | POA: Diagnosis not present

## 2021-01-21 DIAGNOSIS — N393 Stress incontinence (female) (male): Secondary | ICD-10-CM | POA: Diagnosis not present

## 2021-01-29 NOTE — Progress Notes (Deleted)
[facility]  PATIENT: Stephanie Macdonald DOB: Jun 22, 1940  REFERRING CLINICIAN: Helen Hashimoto., MD HISTORY FROM: *** REASON FOR VISIT: vertigo   HISTORICAL  CHIEF COMPLAINT:  No chief complaint on file.   HISTORY OF PRESENT ILLNESS:  ***  Had a CTA head/neck in 2010 for vertigo which showed no significant stenosis.  OTHER MEDICAL CONDITIONS: ***   REVIEW OF SYSTEMS: Full 14 system review of systems performed and negative with exception of: ***  ALLERGIES: Allergies  Allergen Reactions   Codeine     Other reaction(s): VOMITING   Cymbalta [Duloxetine Hcl]    Duloxetine    Hydrocodone-Acetaminophen    Metaxalone    Other    Tramadol    Venlafaxine     HOME MEDICATIONS: Outpatient Medications Prior to Visit  Medication Sig Dispense Refill   alendronate (FOSAMAX) 70 MG tablet Take 70 mg by mouth once a week. Take with a full glass of water on an empty stomach.     BIOTIN PO Take by mouth.     Calcium Carb-Cholecalciferol 600-800 MG-UNIT CHEW once daily.     fesoterodine (TOVIAZ) 8 MG TB24 tablet Take 8 mg by mouth daily.     fexofenadine (ALLEGRA) 60 MG tablet Take 60 mg by mouth 2 (two) times daily.     fluticasone (FLONASE) 50 MCG/ACT nasal spray Place into both nostrils daily.     gabapentin (NEURONTIN) 600 MG tablet TK 1 AND 1/2 TS PO TID  0   losartan (COZAAR) 100 MG tablet 100 mg.     meloxicam (MOBIC) 15 MG tablet TK 1 T PO QAM  2   Olopatadine HCl (PATADAY) 0.2 % SOLN Apply to eye.     polyethylene glycol (MIRALAX / GLYCOLAX) packet daily as needed.     psyllium (TGT PSYLLIUM FIBER) 0.52 G capsule 28.3 %.     rosuvastatin (CRESTOR) 5 MG tablet 5 mg.     White Petrolatum-Mineral Oil (ARTIFICIAL TEARS) ointment as needed.     Facility-Administered Medications Prior to Visit  Medication Dose Route Frequency Provider Last Rate Last Admin   triamcinolone acetonide (KENALOG) 10 MG/ML injection 10 mg  10 mg Other Once Landis Martins,  DPM        PAST MEDICAL HISTORY: Past Medical History:  Diagnosis Date   Arthritis    rheumatoid   Diabetes mellitus without complication ([place])    Fibromyalgia    Hyperlipidemia    Osteopenia     PAST SURGICAL HISTORY: Past Surgical History:  Procedure Laterality Date   APPENDECTOMY     BREAST ENHANCEMENT SURGERY     CATARACT EXTRACTION Right    HAND SURGERY     PLANTAR FASCIA SURGERY     skin lesions     TUBAL LIGATION      FAMILY HISTORY: Family History  Problem Relation Age of Onset   Colon cancer Mother    Hypertension Mother    Skin cancer Father     SOCIAL HISTORY: Social History   Socioeconomic History   Marital status: Married    Spouse name: Not on file   Number of children: Not on file   Years of education: Not on file   Highest education level: Not on file  Occupational History   Not on file  Tobacco Use   Smoking status: Never   Smokeless tobacco: Never  Substance and Sexual Activity   Alcohol use: Never   Drug use: Never   Sexual activity: Not on file  Other  Topics Concern   Not on file  Social History Narrative   Not on file   Social Determinants of Health   Financial Resource Strain: Not on file  Food Insecurity: Not on file  Transportation Needs: Not on file  Physical Activity: Not on file  Stress: Not on file  Social Connections: Not on file  Intimate Partner Violence: Not on file     PHYSICAL EXAM ***  GENERAL EXAM/CONSTITUTIONAL: Vitals: There were no vitals filed for this visit. There is no height or weight on file to calculate BMI. Wt Readings from Last 3 Encounters:  No data found for Wt   Patient is in no distress; well developed, nourished and groomed; neck is supple  CARDIOVASCULAR: Examination of carotid arteries is normal; no carotid bruits Regular rate and rhythm, no murmurs Examination of peripheral vascular system by observation and palpation is normal  EYES: Pupils round and reactive to light, Visual  fields full to confrontation, Extraocular movements intacts,   MUSCULOSKELETAL: Gait, strength, tone, movements noted in Neurologic exam below  NEUROLOGIC: MENTAL STATUS:  No flowsheet data found. awake, alert, oriented to person, place and time recent and remote memory intact normal attention and concentration language fluent, comprehension intact, naming intact fund of knowledge appropriate  CRANIAL NERVE:  2nd - no papilledema or hemorrhages on fundoscopic exam 2nd, 3rd, 4th, 6th - pupils equal and reactive to light, visual fields full to confrontation, extraocular muscles intact, no nystagmus 5th - facial sensation symmetric 7th - facial strength symmetric 8th - hearing intact 9th - palate elevates symmetrically, uvula midline 11th - shoulder shrug symmetric 12th - tongue protrusion midline  MOTOR:  normal bulk and tone, full strength in the BUE, BLE  SENSORY:  normal and symmetric to light touch, pinprick, temperature, vibration  COORDINATION:  finger-nose-finger, fine finger movements normal  REFLEXES:  deep tendon reflexes present and symmetric  GAIT/STATION:  normal     DIAGNOSTIC DATA (LABS, IMAGING, TESTING) - I reviewed patient records, labs, notes, testing and imaging myself where available.  Lab Results  Component Value Date   HGB 13.3 03/28/2009   HCT 39.0 03/28/2009      Component Value Date/Time   NA 142 03/28/2009 0834   K 3.6 03/28/2009 0834   CL 107 03/28/2009 0834   GLUCOSE 80 03/28/2009 0834   BUN 20 03/28/2009 0834   CREATININE 0.8 03/28/2009 0834   No results found for: CHOL, HDL, LDLCALC, LDLDIRECT, TRIG, CHOLHDL No results found for: HGBA1C No results found for: VITAMINB12 No results found for: TSH  ***    ASSESSMENT AND PLAN  80 y.o. year old female with ***   No diagnosis found.    PLAN:   No orders of the defined types were placed in this encounter.   No orders of the defined types were placed in this  encounter.   No follow-ups on file.    Genia Harold, MD  I spent an average of *** chart reviewing and counseling the patient, with at least 50% of the time face to face with the patient.   [facility] [address], [place], [place] [address] [phone]

## 2021-02-03 ENCOUNTER — Other Ambulatory Visit: Payer: Self-pay

## 2021-02-03 ENCOUNTER — Encounter: Payer: Self-pay | Admitting: Psychiatry

## 2021-02-03 ENCOUNTER — Ambulatory Visit: Payer: Medicare PPO | Admitting: Psychiatry

## 2021-02-03 VITALS — BP 126/78 | HR 62 | Ht 60.5 in | Wt 131.0 lb

## 2021-02-03 DIAGNOSIS — R413 Other amnesia: Secondary | ICD-10-CM | POA: Diagnosis not present

## 2021-02-03 DIAGNOSIS — R42 Dizziness and giddiness: Secondary | ICD-10-CM | POA: Diagnosis not present

## 2021-02-03 NOTE — Patient Instructions (Addendum)
MRI brain Referral to vestibular therapy Blood work - thyroid and B12 levels   Tasks to improve attention/working memory 1. Good sleep hygiene (7-8 hrs of sleep) 2. Learning a new skill (Painting, Carpentry, Pottery, new language, Knitting). 3.Cognitive exercises (keep a daily journal, Puzzles) 4. Physical exercise and training  (30 min/day X 4 days week) 5. Being on Antidepressant if needed 6.Yoga, Meditation, Tai Chi 7. Decrease alcohol intake 8.Have a clear schedule and structure in daily routine

## 2021-02-03 NOTE — Progress Notes (Signed)
[facility]  PATIENT: Stephanie Macdonald DOB: 20-Apr-1940  REFERRING CLINICIAN: Helen Hashimoto., MD HISTORY FROM: self REASON FOR VISIT: vertigo   HISTORICAL  CHIEF COMPLAINT:  Chief Complaint  Patient presents with   New Patient (Initial Visit)    Rm 3 alone here for consult on worsening vertigo- Pt reports when she lays her head back the room will start to spin she report these symptoms have been doing on for a while. She also reports trouble with her short term memory.     HISTORY OF PRESENT ILLNESS:  The patient presents for evaluation of vertigo. States she has had this for several years. Was previously evaluated by ENT and told she likely did not have BPPV. Has vertigo every time she lies on her back. It is worse when turning her head to the right. Can also be triggered by moving up and down. Vertigo is associated with nausea and sometimes vomiting. She will occasionally get double vision if she is reading for a long time. Will have brief tinnitus but no hearing loss. Can stop vertigo by changing her head position. Vertigo will last until she changes position. Had to stop aerobics and yoga due to these symptoms.  She has no history of headaches. Denies balance issues and is very physically active. Will sometimes feel light-headed and pass out if her blood pressure drops. Mostly is an issue if she doesn't eat or is dehydrated.  Does note memory issues over the past year. Particularly struggles with word finding difficulty and short term memory. Will walk in a room and forget what she was doing. She is a Higher education careers adviser and makes more mistakes than she used to, like bringing double checks to a bank.  Had a CTA head/neck in 2010 which showed atherosclerosis without flow limiting stenosis.  OTHER MEDICAL CONDITIONS: HTN, HLD, fibromyalgia, osteoarthritis   REVIEW OF SYSTEMS: Full 14 system review of systems performed and negative with exception of:  vertigo  ALLERGIES: Allergies  Allergen Reactions   Codeine     Other reaction(s): VOMITING   Cymbalta [Duloxetine Hcl]    Duloxetine    Hydrocodone-Acetaminophen    Metaxalone    Other    Tramadol    Venlafaxine     HOME MEDICATIONS: Outpatient Medications Prior to Visit  Medication Sig Dispense Refill   alendronate (FOSAMAX) 70 MG tablet Take 70 mg by mouth once a week. Take with a full glass of water on an empty stomach.     BIOTIN PO Take by mouth.     Calcium Carb-Cholecalciferol 600-800 MG-UNIT CHEW once daily.     fesoterodine (TOVIAZ) 8 MG TB24 tablet Take 8 mg by mouth daily.     fexofenadine (ALLEGRA) 60 MG tablet Take 60 mg by mouth 2 (two) times daily.     fluticasone (FLONASE) 50 MCG/ACT nasal spray Place into both nostrils daily.     gabapentin (NEURONTIN) 600 MG tablet TK 1 AND 1/2 TS PO TID  0   losartan (COZAAR) 100 MG tablet 100 mg.     Olopatadine HCl 0.2 % SOLN Apply to eye.     polyethylene glycol (MIRALAX / GLYCOLAX) packet daily as needed.     psyllium (REGULOID) 0.52 g capsule 28.3 %.     rosuvastatin (CRESTOR) 5 MG tablet 5 mg.     White Petrolatum-Mineral Oil (ARTIFICIAL TEARS) ointment as needed.     meloxicam (MOBIC) 15 MG tablet TK 1 T PO QAM (Patient not taking: Reported on 02/03/2021)  2   Facility-Administered Medications Prior to Visit  Medication Dose Route Frequency Provider Last Rate Last Admin   triamcinolone acetonide (KENALOG) 10 MG/ML injection 10 mg  10 mg Other Once Landis Martins, DPM        PAST MEDICAL HISTORY: Past Medical History:  Diagnosis Date   Arthritis    rheumatoid   Diabetes mellitus without complication ([place])    Fibromyalgia    Hyperlipidemia    Osteopenia     PAST SURGICAL HISTORY: Past Surgical History:  Procedure Laterality Date   APPENDECTOMY     BREAST ENHANCEMENT SURGERY     CATARACT EXTRACTION Right    HAND SURGERY     PLANTAR FASCIA SURGERY     skin lesions     TUBAL LIGATION      FAMILY  HISTORY: Family History  Problem Relation Age of Onset   Colon cancer Mother    Hypertension Mother    Skin cancer Father     SOCIAL HISTORY: Social History   Socioeconomic History   Marital status: Married    Spouse name: Not on file   Number of children: Not on file   Years of education: Not on file   Highest education level: Not on file  Occupational History   Not on file  Tobacco Use   Smoking status: Never   Smokeless tobacco: Never  Substance and Sexual Activity   Alcohol use: Never   Drug use: Never   Sexual activity: Not on file  Other Topics Concern   Not on file  Social History Narrative   Right handed    Some caffeine    Lives at home with her spouse.    Social Determinants of Health   Financial Resource Strain: Not on file  Food Insecurity: Not on file  Transportation Needs: Not on file  Physical Activity: Not on file  Stress: Not on file  Social Connections: Not on file  Intimate Partner Violence: Not on file     PHYSICAL EXAM  GENERAL EXAM/CONSTITUTIONAL: Vitals:  Vitals:   02/03/21 1122  BP: 126/78  Pulse: 62  Weight: 131 lb (59.4 kg)  Height: 5' 0.5" (1.537 m)   Body mass index is 25.16 kg/m. Wt Readings from Last 3 Encounters:  02/03/21 131 lb (59.4 kg)   Patient is in no distress; well developed, nourished and groomed; neck is supple  CARDIOVASCULAR: Examination of peripheral vascular system by observation and palpation is normal  EYES: Pupils round and reactive to light, Visual fields full to confrontation, Extraocular movements intact  MUSCULOSKELETAL: Gait, strength, tone, movements noted in Neurologic exam below  NEUROLOGIC: MENTAL STATUS:  awake, alert, oriented to person, place and time  CRANIAL NERVE:  2nd, 3rd, 4th, 6th - pupils equal and reactive to light, visual fields full to confrontation, extraocular muscles intact, no nystagmus 5th - facial sensation symmetric 7th - facial strength symmetric 8th - hearing  intact 9th - palate elevates symmetrically, uvula midline 11th - shoulder shrug symmetric 12th - tongue protrusion midline  MOTOR:  normal bulk and tone, full strength in the BUE, BLE  SENSORY:  normal and symmetric to light touch all 4 extremities  COORDINATION:  finger-nose-finger, heel-to-shin normal bilaterally  REFLEXES:  deep tendon reflexes present and symmetric  GAIT/STATION:  normal     DIAGNOSTIC DATA (LABS, IMAGING, TESTING) - I reviewed patient records, labs, notes, testing and imaging myself where available.  Lab Results  Component Value Date   HGB 13.3 03/28/2009   HCT  39.0 03/28/2009      Component Value Date/Time   NA 142 03/28/2009 0834   K 3.6 03/28/2009 0834   CL 107 03/28/2009 0834   GLUCOSE 80 03/28/2009 0834   BUN 20 03/28/2009 0834   CREATININE 0.8 03/28/2009 0834   No results found for: CHOL, HDL, LDLCALC, LDLDIRECT, TRIG, CHOLHDL No results found for: HGBA1C No results found for: VITAMINB12 No results found for: TSH  CTA in 2010 with atherosclerosis but no flow limiting stenosis.    ASSESSMENT AND PLAN  80 y.o. year old female with a history of HTN, HLD who presents for evaluation of vertigo and memory loss. Vertigo which can be induced by turning head to the right and stopped when turning the head left does raise concern for BPPV. She does have vascular risk factors and has never had an MRI. Will order MRI brain to assess for neurodegeneration as well as vascular disease/stroke. Referral to vestibular rehab placed. Will check blood work for reversible causes of dementia. She will return after testing for formal memory testing.   1. Vertigo   2. Memory loss       PLAN: -MRI brain -vitamin B12, TSH levels -Referral to vestibular rehab -Return after testing is complete for more memory testing  Orders Placed This Encounter  Procedures   Vitamin B12   TSH   Ambulatory referral to Physical Therapy     No orders of the  defined types were placed in this encounter.   Return in about 2 months (around 04/05/2021).    Genia Harold, MD 02/03/21 12:18 PM   I spent an average of 38 minutes chart reviewing and counseling the patient, with at least 50% of the time face to face with the patient.   [facility] [address], [place], [place] [address] [phone]

## 2021-02-04 LAB — TSH: TSH: 1.23 u[IU]/mL (ref 0.450–4.500)

## 2021-02-04 LAB — VITAMIN B12: Vitamin B-12: 530 pg/mL (ref 232–1245)

## 2021-02-05 ENCOUNTER — Telehealth: Payer: Self-pay | Admitting: Psychiatry

## 2021-02-05 NOTE — Telephone Encounter (Signed)
PT referral has been sent to [facility] in [place]. Phone: [phone].

## 2021-02-05 NOTE — Telephone Encounter (Signed)
Pt asking for a anti-nausea medication for her first session for the PT on Monday and usually she get nauseous.

## 2021-02-06 ENCOUNTER — Other Ambulatory Visit: Payer: Self-pay | Admitting: Psychiatry

## 2021-02-06 DIAGNOSIS — M5431 Sciatica, right side: Secondary | ICD-10-CM | POA: Diagnosis not present

## 2021-02-06 MED ORDER — ONDANSETRON HCL 4 MG PO TABS: 4.0000 mg | ORAL_TABLET | Freq: Three times a day (TID) | ORAL | 2 refills | Status: DC | PRN

## 2021-02-06 NOTE — Telephone Encounter (Signed)
I called pt and relayed that rx has been sent.

## 2021-02-06 NOTE — Telephone Encounter (Signed)
Rx for zofran sent to her pharmacy 

## 2021-02-10 DIAGNOSIS — R42 Dizziness and giddiness: Secondary | ICD-10-CM | POA: Diagnosis not present

## 2021-02-10 DIAGNOSIS — R2681 Unsteadiness on feet: Secondary | ICD-10-CM | POA: Diagnosis not present

## 2021-02-10 DIAGNOSIS — H8110 Benign paroxysmal vertigo, unspecified ear: Secondary | ICD-10-CM | POA: Diagnosis not present

## 2021-02-17 DIAGNOSIS — R42 Dizziness and giddiness: Secondary | ICD-10-CM | POA: Diagnosis not present

## 2021-02-17 DIAGNOSIS — Z79899 Other long term (current) drug therapy: Secondary | ICD-10-CM | POA: Diagnosis not present

## 2021-02-17 DIAGNOSIS — R2681 Unsteadiness on feet: Secondary | ICD-10-CM | POA: Diagnosis not present

## 2021-02-17 DIAGNOSIS — H8110 Benign paroxysmal vertigo, unspecified ear: Secondary | ICD-10-CM | POA: Diagnosis not present

## 2021-02-17 DIAGNOSIS — Z6824 Body mass index (BMI) 24.0-24.9, adult: Secondary | ICD-10-CM | POA: Diagnosis not present

## 2021-02-17 DIAGNOSIS — M5416 Radiculopathy, lumbar region: Secondary | ICD-10-CM | POA: Diagnosis not present

## 2021-02-24 DIAGNOSIS — R42 Dizziness and giddiness: Secondary | ICD-10-CM | POA: Diagnosis not present

## 2021-02-24 DIAGNOSIS — R2681 Unsteadiness on feet: Secondary | ICD-10-CM | POA: Diagnosis not present

## 2021-02-24 DIAGNOSIS — H8110 Benign paroxysmal vertigo, unspecified ear: Secondary | ICD-10-CM | POA: Diagnosis not present

## 2021-03-12 DIAGNOSIS — H8110 Benign paroxysmal vertigo, unspecified ear: Secondary | ICD-10-CM | POA: Diagnosis not present

## 2021-03-12 DIAGNOSIS — R2681 Unsteadiness on feet: Secondary | ICD-10-CM | POA: Diagnosis not present

## 2021-03-12 DIAGNOSIS — R42 Dizziness and giddiness: Secondary | ICD-10-CM | POA: Diagnosis not present

## 2021-03-25 ENCOUNTER — Encounter: Payer: Self-pay | Admitting: *Deleted

## 2021-03-27 ENCOUNTER — Telehealth: Payer: Self-pay | Admitting: Psychiatry

## 2021-03-27 ENCOUNTER — Ambulatory Visit: Payer: Medicare PPO | Admitting: Psychiatry

## 2021-03-27 ENCOUNTER — Encounter: Payer: Self-pay | Admitting: Psychiatry

## 2021-03-27 VITALS — BP 147/83 | HR 62 | Ht 60.5 in | Wt 135.0 lb

## 2021-03-27 DIAGNOSIS — M5416 Radiculopathy, lumbar region: Secondary | ICD-10-CM | POA: Diagnosis not present

## 2021-03-27 DIAGNOSIS — R202 Paresthesia of skin: Secondary | ICD-10-CM | POA: Diagnosis not present

## 2021-03-27 MED ORDER — GABAPENTIN 600 MG PO TABS: ORAL_TABLET | ORAL | 3 refills | Status: DC

## 2021-03-27 NOTE — Telephone Encounter (Signed)
[facility]set[facility] Josem Kaufmann: [phone] (exp. 03/27/21 to 04/26/21) order sent to GI, they will reach out to the patient to schedule.

## 2021-03-27 NOTE — Progress Notes (Addendum)
[facility]  PATIENT: Stephanie Macdonald DOB: Oct 07, 1940  REFERRING CLINICIAN: Helen Hashimoto., MD HISTORY FROM: self REASON FOR VISIT: lumbar radiculopathy   HISTORICAL  CHIEF COMPLAINT:  Chief Complaint  Patient presents with   Lumbar radiculopathy    Rm 1 New referral for est pt  "have had this problem before but could get over it with rest; now worse"    HISTORY OF PRESENT ILLNESS:  The patient presents for evaluation of lumbar radiculopathy. In November she started to develop burning pain in her back, groin, and hip which radiated down the front of her leg and down her shin. Bottom of her left foot has a pins and needles sensation. She has not noticed any weakness in her leg, but the pain interferes with her walking. Denies incontinence or saddle anesthesia. Has a history of sciatica but states feels this is different. Went to urgent care and got two shots in her hip. This helped for one day, but then the pain returned. Tried one dose of Skelaxin which helped but knocked her out.   Takes gabapentin 1200 mg BID for pain between her shoulder blades. It does not cause any side effects.  She was previously seen for vertigo. Her vertigo has resolved with vestibular rehab.   OTHER MEDICAL CONDITIONS: HTN, HLD, fibromyalgia, osteoarthritis   REVIEW OF SYSTEMS: Full 14 system review of systems performed and negative with exception of: leg pain  ALLERGIES: Allergies  Allergen Reactions   Codeine     Other reaction(s): VOMITING   Cymbalta [Duloxetine Hcl]    Duloxetine    Hydrocodone-Acetaminophen    Metaxalone    Other    Tramadol    Venlafaxine     HOME MEDICATIONS: Outpatient Medications Prior to Visit  Medication Sig Dispense Refill   alendronate (FOSAMAX) 70 MG tablet Take 70 mg by mouth once a week. Take with a full glass of water on an empty stomach.     BIOTIN PO Take by mouth.     Calcium Carb-Cholecalciferol 600-800 MG-UNIT CHEW once  daily.     fesoterodine (TOVIAZ) 8 MG TB24 tablet Take 8 mg by mouth daily.     fexofenadine (ALLEGRA) 60 MG tablet Take 60 mg by mouth 2 (two) times daily.     fluticasone (FLONASE) 50 MCG/ACT nasal spray Place into both nostrils daily.     losartan (COZAAR) 100 MG tablet 100 mg.     ondansetron (ZOFRAN) 4 MG tablet Take 1 tablet (4 mg total) by mouth every 8 (eight) hours as needed for nausea or vomiting. 20 tablet 2   polyethylene glycol (MIRALAX / GLYCOLAX) packet daily as needed.     rosuvastatin (CRESTOR) 5 MG tablet 5 mg.     White Petrolatum-Mineral Oil (ARTIFICIAL TEARS) ointment as needed.     gabapentin (NEURONTIN) 600 MG tablet TK 1 AND 1/2 TS PO TID  0   Olopatadine HCl 0.2 % SOLN Apply to eye. (Patient not taking: Reported on 03/27/2021)     psyllium (REGULOID) 0.52 g capsule 28.3 %.     Facility-Administered Medications Prior to Visit  Medication Dose Route Frequency Provider Last Rate Last Admin   triamcinolone acetonide (KENALOG) 10 MG/ML injection 10 mg  10 mg Other Once Landis Martins, DPM        PAST MEDICAL HISTORY: Past Medical History:  Diagnosis Date   Arthritis    rheumatoid   Benign hypertension    Diabetes mellitus without complication ([facility])    Fibromyalgia  Hyperlipidemia    Lumbar radiculopathy, acute    Osteopenia     PAST SURGICAL HISTORY: Past Surgical History:  Procedure Laterality Date   APPENDECTOMY     BREAST ENHANCEMENT SURGERY     CATARACT EXTRACTION Right    HAND SURGERY     PLANTAR FASCIA SURGERY     skin lesions     TUBAL LIGATION      FAMILY HISTORY: Family History  Problem Relation Age of Onset   Colon cancer Mother    Hypertension Mother    Cancer Father        breast   Skin cancer Father    Heart attack Maternal Grandfather    Heart attack Paternal Grandfather     SOCIAL HISTORY: Social History   Socioeconomic History   Marital status: Married    Spouse name: Engineer, technical sales   Number of children: 3   Years of education:  Not on file   Highest education level: Not on file  Occupational History   Not on file  Tobacco Use   Smoking status: Never   Smokeless tobacco: Never  Substance and Sexual Activity   Alcohol use: Never   Drug use: Never   Sexual activity: Not on file  Other Topics Concern   Not on file  Social History Narrative   Right handed    Some caffeine    Lives at home with her spouse.    Social Determinants of Health   Financial Resource Strain: Not on file  Food Insecurity: Not on file  Transportation Needs: Not on file  Physical Activity: Not on file  Stress: Not on file  Social Connections: Not on file  Intimate Partner Violence: Not on file     PHYSICAL EXAM  GENERAL EXAM/CONSTITUTIONAL: Vitals:  Vitals:   03/27/21 0959  BP: (!) 147/83  Pulse: 62  Weight: 135 lb (61.2 kg)  Height: 5' 0.5" (1.537 m)   Body mass index is 25.93 kg/m. Wt Readings from Last 3 Encounters:  03/27/21 135 lb (61.2 kg)  02/03/21 131 lb (59.4 kg)   Patient is in no distress; well developed, nourished and groomed; neck is supple  CARDIOVASCULAR: Examination of peripheral vascular system by observation and palpation is normal  EYES: Pupils round and reactive to light, Visual fields full to confrontation, Extraocular movements intacts,   MUSCULOSKELETAL: Gait, strength, tone, movements noted in Neurologic exam below  NEUROLOGIC: MENTAL STATUS:  awake, alert, oriented to person, place and time  CRANIAL NERVE:  2nd, 3rd, 4th, 6th - pupils equal and reactive to light, visual fields full to confrontation, extraocular muscles intact, no nystagmus 5th - facial sensation symmetric 7th - facial strength symmetric 8th - hearing intact 9th - palate elevates symmetrically, uvula midline 11th - shoulder shrug symmetric 12th - tongue protrusion midline  MOTOR:  4/5 right knee flexion, otherwise 5/5 throughout  SENSORY:  Diminished sensation to light touch and pinprick over dorsum of foot,  right shin, right anterior thigh. Sensation intact over posterior leg and thigh. Diminished vibration over bilateral knees and right foot.  COORDINATION:  finger-nose-finger intact  REFLEXES:  Brisk right patellar reflex without spread  GAIT/STATION:  Antalgic gait     DIAGNOSTIC DATA (LABS, IMAGING, TESTING) - I reviewed patient records, labs, notes, testing and imaging myself where available.  Lab Results  Component Value Date   HGB 13.3 03/28/2009   HCT 39.0 03/28/2009      Component Value Date/Time   NA 142 03/28/2009 0834   K  3.6 03/28/2009 0834   CL 107 03/28/2009 0834   GLUCOSE 80 03/28/2009 0834   BUN 20 03/28/2009 0834   CREATININE 0.8 03/28/2009 0834   No results found for: CHOL, HDL, LDLCALC, LDLDIRECT, TRIG, CHOLHDL No results found for: HGBA1C Lab Results  Component Value Date   VITAMINB12 530 02/03/2021   Lab Results  Component Value Date   TSH 1.230 02/03/2021    ASSESSMENT AND PLAN  81 y.o. year old female with a history of HTN, HLD, fibromyalgia, osteoarthritis who presents for evaluation of burning leg pain. Her exam is significant for diminished sensation over her anterior leg and thigh, mild weakness of right knee flexion, and brisk right patellar reflex. Meralgia paresthetica can cause burning of the anterior thigh, but would not expect this to cause all of her exam findings. Will order MRI lumbar spine to assess for lumbar stenosis causing radiculopathy. A1c ordered to see if prediabetes/diabetes may be contributing to paresthesias. Referral to physical therapy placed. Will increase gabapentin to see if this helps reduce her burning pain and paresthesias.   1. Right leg paresthesias   2. Lumbar radiculopathy       PLAN: -A1c -MRI L-spine -Increase gabapentin to 1200/300/1200 x1 week, then 1200/600/1200 -Referral to physical therapy -Next steps: consider EMG if MRI unrevealing, consider SNRI if no pain relief with gabapentin  Orders  Placed This Encounter  Procedures   MR LUMBAR SPINE WO CONTRAST   Hemoglobin A1c   Ambulatory referral to Physical Therapy    Meds ordered this encounter  Medications   gabapentin (NEURONTIN) 600 MG tablet    Sig: Take 1200 mg in the morning, 600 mg in the afternoon, and 1200 mg at night    Dispense:  150 tablet    Refill:  3   Follow up 04/16/21    Genia Harold, MD 03/27/21 10:51 AM  I spent an average of 50 minutes chart reviewing and counseling the patient, with at least 50% of the time face to face with the patient.   [facility] [address], [place], [place] [address] [phone]

## 2021-03-27 NOTE — Telephone Encounter (Signed)
Referral sent to [facility]. Phone [phone].

## 2021-03-27 NOTE — Patient Instructions (Signed)
Take an extra half tablet of gabapentin in the afternoon (1200 mg in the morning, 300 mg in the afternoon, and 1200 mg at night). If this does not cause drowsiness you can increase to one tablet of gabapentin in the afternoon for a total of 1200 mg in the morning, 600 mg in the afternoon, and 1200 mg at night. MRI of lumbar spine Referral to physical therapy

## 2021-03-28 DIAGNOSIS — R2681 Unsteadiness on feet: Secondary | ICD-10-CM | POA: Diagnosis not present

## 2021-03-28 DIAGNOSIS — M5416 Radiculopathy, lumbar region: Secondary | ICD-10-CM | POA: Diagnosis not present

## 2021-03-28 LAB — HEMOGLOBIN A1C
Est. average glucose Bld gHb Est-mCnc: 108 mg/dL
Hgb A1c MFr Bld: 5.4 % (ref 4.8–5.6)

## 2021-03-30 ENCOUNTER — Other Ambulatory Visit: Payer: Self-pay

## 2021-03-30 ENCOUNTER — Ambulatory Visit
Admission: RE | Admit: 2021-03-30 | Discharge: 2021-03-30 | Disposition: A | Discharge status: Home / Self Care | Payer: Medicare Other | Source: Ambulatory Visit | Attending: Psychiatry | Admitting: Psychiatry

## 2021-03-30 DIAGNOSIS — M5416 Radiculopathy, lumbar region: Secondary | ICD-10-CM | POA: Diagnosis not present

## 2021-04-02 DIAGNOSIS — R2681 Unsteadiness on feet: Secondary | ICD-10-CM | POA: Diagnosis not present

## 2021-04-02 DIAGNOSIS — M5416 Radiculopathy, lumbar region: Secondary | ICD-10-CM | POA: Diagnosis not present

## 2021-04-03 DIAGNOSIS — I10 Essential (primary) hypertension: Secondary | ICD-10-CM | POA: Diagnosis not present

## 2021-04-03 DIAGNOSIS — M1990 Unspecified osteoarthritis, unspecified site: Secondary | ICD-10-CM | POA: Diagnosis not present

## 2021-04-03 DIAGNOSIS — H547 Unspecified visual loss: Secondary | ICD-10-CM | POA: Diagnosis not present

## 2021-04-03 DIAGNOSIS — I739 Peripheral vascular disease, unspecified: Secondary | ICD-10-CM | POA: Diagnosis not present

## 2021-04-03 DIAGNOSIS — M810 Age-related osteoporosis without current pathological fracture: Secondary | ICD-10-CM | POA: Diagnosis not present

## 2021-04-03 DIAGNOSIS — G629 Polyneuropathy, unspecified: Secondary | ICD-10-CM | POA: Diagnosis not present

## 2021-04-03 DIAGNOSIS — E785 Hyperlipidemia, unspecified: Secondary | ICD-10-CM | POA: Diagnosis not present

## 2021-04-03 DIAGNOSIS — G8929 Other chronic pain: Secondary | ICD-10-CM | POA: Diagnosis not present

## 2021-04-03 DIAGNOSIS — K5900 Constipation, unspecified: Secondary | ICD-10-CM | POA: Diagnosis not present

## 2021-04-04 DIAGNOSIS — M5416 Radiculopathy, lumbar region: Secondary | ICD-10-CM | POA: Diagnosis not present

## 2021-04-04 DIAGNOSIS — R2681 Unsteadiness on feet: Secondary | ICD-10-CM | POA: Diagnosis not present

## 2021-04-07 DIAGNOSIS — R2681 Unsteadiness on feet: Secondary | ICD-10-CM | POA: Diagnosis not present

## 2021-04-07 DIAGNOSIS — M5416 Radiculopathy, lumbar region: Secondary | ICD-10-CM | POA: Diagnosis not present

## 2021-04-09 DIAGNOSIS — M5416 Radiculopathy, lumbar region: Secondary | ICD-10-CM | POA: Diagnosis not present

## 2021-04-09 DIAGNOSIS — R2681 Unsteadiness on feet: Secondary | ICD-10-CM | POA: Diagnosis not present

## 2021-04-14 DIAGNOSIS — R2681 Unsteadiness on feet: Secondary | ICD-10-CM | POA: Diagnosis not present

## 2021-04-14 DIAGNOSIS — M5416 Radiculopathy, lumbar region: Secondary | ICD-10-CM | POA: Diagnosis not present

## 2021-04-16 ENCOUNTER — Other Ambulatory Visit: Payer: Self-pay

## 2021-04-16 ENCOUNTER — Encounter: Payer: Self-pay | Admitting: Psychiatry

## 2021-04-16 ENCOUNTER — Ambulatory Visit: Payer: Medicare PPO | Admitting: Psychiatry

## 2021-04-16 VITALS — BP 141/78 | HR 65 | Ht 60.5 in | Wt 131.2 lb

## 2021-04-16 DIAGNOSIS — R413 Other amnesia: Secondary | ICD-10-CM

## 2021-04-16 DIAGNOSIS — M5416 Radiculopathy, lumbar region: Secondary | ICD-10-CM

## 2021-04-16 NOTE — Progress Notes (Signed)
° °  CC:  vertigo, leg pain  Follow-up Visit  Last visit: 03/27/21  Brief HPI: 81 year old female who follows in clinic for vertigo, memory loss, and lower back/leg pain.  At her last visit she was referred to vestibular rehab. MRI brain was ordered for memory loss evaluation. MRI L-spine was ordered to assess for lumbar radiculopathy and she was referred to PT for back/leg pain. Gabapentin was increased to 1200/600/1200  Interval History: MRI L-spine showed right disc protrusion at L5-S1 which makes contact with the nerve root. She has had a lot of improvement with her back and leg pain since starting physical therapy. She has been able to walk up and down the hill by her house several times per day. Increasing gabapentin helped reduce her pain at night. She is tolerating this well without side effects.  She has not had any more episodes of vertigo.  She continues to have concerns about her memory. States she had one episode where she left the stove on and forgot about it. Forgot where she was driving once and it took a few moments to remember where she was going.  Physical Exam:   Vital Signs: BP (!) 141/78    Pulse 65    Ht 5' 0.5" (1.537 m)    Wt 131 lb 4 oz (59.5 kg)    BMI 25.21 kg/m  GENERAL:  well appearing, in no acute distress, alert. MMSE 25/30 (-1 date, -2 serial sevens, -1 copying picture) SKIN:  Color, texture, turgor normal. No rashes or lesions HEAD:  Normocephalic/atraumatic. RESP: normal respiratory effort MSK:  No gross joint deformities.   NEUROLOGICAL: Mental Status: Alert, oriented to person, place and time, Follows commands, and Speech fluent and appropriate. Cranial Nerves: PERRL, face symmetric, no dysarthria, hearing grossly intact Motor: moves all extremities equally Gait: normal-based.  IMPRESSION: 81 year old female who presents for follow up of back/leg pain, and memory loss. She has noticed significant improvement in her back and leg pain with physical  therapy as well as increased gabapentin. Will continue current regimen for now. MMSE today 25/30, which is within normal limits. She continues to have anxiety regarding increased forgetfulness. Offered neuropsychological testing, which she declined at this time. Will see her back in 6-12 months to repeat memory testing. Provided information regarding general brain health measures including regular physical and cognitive exercise.  PLAN: -Continue gabapentin 1200/600/1200 -Continue physical therapy for lower back -next steps: consider neuropsychological testing, MRI brain if memory concerns persist  Follow-up: 6-12 months  I spent a total of 36 minutes on the date of the service. Discussed medication side effects, adverse reactions and drug interactions. Written educational materials and patient instructions outlining all of the above were given.  Genia Harold, MD 04/16/21 2:50 PM

## 2021-04-16 NOTE — Patient Instructions (Signed)
A person with mild cognitive impairment (MCI) experiences memory problems greater than normally expected with aging, but not serious enough to interfere with daily activities.  The patient with MCI complains of difficulty with memory. Typically, the complaints include trouble remembering the names of people they met recently, trouble remembering the flow of a conversation, and an increased tendency to misplace things, or similar problems. In many cases, the individual will be aware of these difficulties and will compensate with increased reliance on notes and calendars.  ? ?Although there is an increased chances of going on to develop dementia, it is not possible currently to predict with certainty which patients with MCI will or will not go on to develop dementia. There is currently no specific treatment for MCI. People leading sedentary lifestyles are at greater risk for developing dementia. Increased physical activity and brain exercise can help with maintaining brain function. ? ?Tasks to improve attention/working memory ?1. Good sleep hygiene (7-8 hrs of sleep) ?2. Learning a new skill (Painting, Carpentry, Pottery, new language, Knitting). ?3.Cognitive exercises (keep a daily journal, Puzzles) ?4. Physical exercise and training  (30 min/day X 4 days week) ?5. Being on Antidepressant if needed ?6.Yoga, Meditation, Tai Chi ?7. Decrease alcohol intake ?8.Have a clear schedule and structure in daily routine ? ?

## 2021-04-18 DIAGNOSIS — R2681 Unsteadiness on feet: Secondary | ICD-10-CM | POA: Diagnosis not present

## 2021-04-18 DIAGNOSIS — M5416 Radiculopathy, lumbar region: Secondary | ICD-10-CM | POA: Diagnosis not present

## 2021-04-21 DIAGNOSIS — R2681 Unsteadiness on feet: Secondary | ICD-10-CM | POA: Diagnosis not present

## 2021-04-21 DIAGNOSIS — M5416 Radiculopathy, lumbar region: Secondary | ICD-10-CM | POA: Diagnosis not present

## 2021-04-23 DIAGNOSIS — M5416 Radiculopathy, lumbar region: Secondary | ICD-10-CM | POA: Diagnosis not present

## 2021-04-23 DIAGNOSIS — R2681 Unsteadiness on feet: Secondary | ICD-10-CM | POA: Diagnosis not present

## 2021-04-30 DIAGNOSIS — R2681 Unsteadiness on feet: Secondary | ICD-10-CM | POA: Diagnosis not present

## 2021-04-30 DIAGNOSIS — M5416 Radiculopathy, lumbar region: Secondary | ICD-10-CM | POA: Diagnosis not present

## 2021-05-09 DIAGNOSIS — M5416 Radiculopathy, lumbar region: Secondary | ICD-10-CM | POA: Diagnosis not present

## 2021-05-09 DIAGNOSIS — R2681 Unsteadiness on feet: Secondary | ICD-10-CM | POA: Diagnosis not present

## 2021-05-14 DIAGNOSIS — R2681 Unsteadiness on feet: Secondary | ICD-10-CM | POA: Diagnosis not present

## 2021-05-14 DIAGNOSIS — M5416 Radiculopathy, lumbar region: Secondary | ICD-10-CM | POA: Diagnosis not present

## 2021-05-16 DIAGNOSIS — R2681 Unsteadiness on feet: Secondary | ICD-10-CM | POA: Diagnosis not present

## 2021-05-16 DIAGNOSIS — M5416 Radiculopathy, lumbar region: Secondary | ICD-10-CM | POA: Diagnosis not present

## 2021-05-19 DIAGNOSIS — R2681 Unsteadiness on feet: Secondary | ICD-10-CM | POA: Diagnosis not present

## 2021-05-19 DIAGNOSIS — M5416 Radiculopathy, lumbar region: Secondary | ICD-10-CM | POA: Diagnosis not present

## 2021-05-21 DIAGNOSIS — R2681 Unsteadiness on feet: Secondary | ICD-10-CM | POA: Diagnosis not present

## 2021-05-21 DIAGNOSIS — M5416 Radiculopathy, lumbar region: Secondary | ICD-10-CM | POA: Diagnosis not present

## 2021-05-29 DIAGNOSIS — M5416 Radiculopathy, lumbar region: Secondary | ICD-10-CM | POA: Diagnosis not present

## 2021-05-29 DIAGNOSIS — R2681 Unsteadiness on feet: Secondary | ICD-10-CM | POA: Diagnosis not present

## 2021-07-01 DIAGNOSIS — M797 Fibromyalgia: Secondary | ICD-10-CM | POA: Diagnosis not present

## 2021-07-01 DIAGNOSIS — Z139 Encounter for screening, unspecified: Secondary | ICD-10-CM | POA: Diagnosis not present

## 2021-07-01 DIAGNOSIS — E785 Hyperlipidemia, unspecified: Secondary | ICD-10-CM | POA: Diagnosis not present

## 2021-07-01 DIAGNOSIS — Z6824 Body mass index (BMI) 24.0-24.9, adult: Secondary | ICD-10-CM | POA: Diagnosis not present

## 2021-07-01 DIAGNOSIS — I10 Essential (primary) hypertension: Secondary | ICD-10-CM | POA: Diagnosis not present

## 2021-07-22 DIAGNOSIS — N3281 Overactive bladder: Secondary | ICD-10-CM | POA: Diagnosis not present

## 2021-07-22 DIAGNOSIS — N952 Postmenopausal atrophic vaginitis: Secondary | ICD-10-CM | POA: Diagnosis not present

## 2021-07-22 DIAGNOSIS — N393 Stress incontinence (female) (male): Secondary | ICD-10-CM | POA: Diagnosis not present

## 2021-07-29 DIAGNOSIS — Z1231 Encounter for screening mammogram for malignant neoplasm of breast: Secondary | ICD-10-CM | POA: Diagnosis not present

## 2021-09-02 DIAGNOSIS — Z1331 Encounter for screening for depression: Secondary | ICD-10-CM | POA: Diagnosis not present

## 2021-09-02 DIAGNOSIS — Z9181 History of falling: Secondary | ICD-10-CM | POA: Diagnosis not present

## 2021-09-02 DIAGNOSIS — Z0000 Encounter for general adult medical examination without abnormal findings: Secondary | ICD-10-CM | POA: Diagnosis not present

## 2021-09-02 DIAGNOSIS — E785 Hyperlipidemia, unspecified: Secondary | ICD-10-CM | POA: Diagnosis not present

## 2021-10-16 ENCOUNTER — Ambulatory Visit: Payer: Medicare PPO | Admitting: Psychiatry

## 2021-10-16 ENCOUNTER — Encounter: Payer: Self-pay | Admitting: Psychiatry

## 2021-10-16 VITALS — BP 146/78 | HR 51 | Ht 60.5 in | Wt 131.2 lb

## 2021-10-16 DIAGNOSIS — G8929 Other chronic pain: Secondary | ICD-10-CM

## 2021-10-16 DIAGNOSIS — R413 Other amnesia: Secondary | ICD-10-CM

## 2021-10-16 DIAGNOSIS — M5440 Lumbago with sciatica, unspecified side: Secondary | ICD-10-CM | POA: Diagnosis not present

## 2021-10-16 NOTE — Progress Notes (Signed)
   CC:  leg pain, memory loss  Follow-up Visit  Last visit: 04/16/21  Brief HPI: 81 year old female who follows in clinic for vertigo, memory loss, and lower back/leg pain.  Interval History: She has finished physical therapy. Back/leg pain has improved. She will still have pain if she stands for too long or walks too quickly.  but she continues to have groin pain. PT told her she may have some hip issues. Gabapentin continues to help with her body pain. She is still able to walk several miles at once.  Still has some concerns with her memory. Sometimes struggles to come up with names. Feels she has to focus strongly to remember things or her mind will wander.  Vertigo has been well-controlled.  Her husband passed away in 09/14/2022. She has a lot of family support and feels she has been coping relatively well.  Physical Exam:   Vital Signs: BP (!) 146/78   Pulse (!) 51   Ht 5' 0.5" (1.537 m)   Wt 131 lb 3.2 oz (59.5 kg)   LMP  (LMP Unknown)   BMI 25.20 kg/m  GENERAL:  well appearing, in no acute distress, alert  SKIN:  Color, texture, turgor normal. No rashes or lesions HEAD:  Normocephalic/atraumatic. RESP: normal respiratory effort MSK:  No gross joint deformities.   NEUROLOGICAL: Mental Status:    10/16/2021   10:40 AM  Montreal Cognitive Assessment   Visuospatial/ Executive (0/5) 3  Naming (0/3) 3  Attention: Read list of digits (0/2) 1  Attention: Read list of letters (0/1) 1  Attention: Serial 7 subtraction starting at 100 (0/3) 3  Language: Repeat phrase (0/2) 1  Language : Fluency (0/1) 1  Abstraction (0/2) 2  Delayed Recall (0/5) 5  Orientation (0/6) 6  Total 26   Cranial Nerves: PERRL, face symmetric, no dysarthria, hearing grossly intact Motor: moves all extremities equally Gait: normal-based.  IMPRESSION: 81 year old female who follows in clinic for vertigo, back/leg pain, and memory loss. Vertigo has resolved. Her pain has improved significantly with  physical therapy and gabapentin. Her memory is stable. MOCA today is 26/30, within normal limits. Will continue gabapentin and have her follow up in one year with repeat memory testing at that time.  PLAN: -Continue gabapentin 1200/600/1200 for leg/back pain  Follow-up: 1 year or sooner if needed  I spent a total of 18 minutes on the date of the service. Discussed medication side effects, adverse reactions and drug interactions. Written educational materials and patient instructions outlining all of the above were given.  Genia Harold, MD 10/16/21 11:10 AM

## 2021-11-13 DIAGNOSIS — M533 Sacrococcygeal disorders, not elsewhere classified: Secondary | ICD-10-CM | POA: Diagnosis not present

## 2021-11-13 HISTORY — DX: Sacrococcygeal disorders, not elsewhere classified: M53.3

## 2021-11-17 ENCOUNTER — Ambulatory Visit: Payer: Self-pay | Admitting: Licensed Clinical Social Worker

## 2021-11-17 NOTE — Patient Instructions (Signed)
Visit Information  Instructions:   Patient was given the following information about care management and care coordination services today, agreed to services, and gave verbal consent: 1.care management/care coordination services include personalized support from designated clinical staff supervised by their physician, including individualized plan of care and coordination with other care providers 2. 24/7 contact phone numbers for assistance for urgent and routine care needs. 3. The patient may stop care management/care coordination services at any time by phone call to the office staff.  Patient verbalizes understanding of instructions and care plan provided today and agrees to view in MyChart. Active MyChart status and patient understanding of how to access instructions and care plan via MyChart confirmed with patient.     No further follow up required: .  Mel Tadros, BSW , MSW Social Worker [facility]  [phone]      

## 2021-11-17 NOTE — Patient Outreach (Signed)
  Care Coordination   Initial Visit Note   11/17/2021 Name: Stephanie Macdonald MRN: [id number] DOB: [birth year]  Stephanie Macdonald is a 81 y.o. year old female who sees Helen Hashimoto., MD for primary care. I spoke with  Vena Austria by phone today.  What matters to the patients health and wellness today?  Patient states she has family support and SW is not needed at this time.    Goals Addressed               This Visit's Progress     MSW Care Coordination (pt-stated)        Patient stated she has family who are supportive and assist with he needs.  Patient will contact her PCP if further SW needs arise.  SW completed SDOH screening and chart review. No needs or barriers present.  Patient denied needing SW services at this time.         SDOH assessments and interventions completed:  Yes     Care Coordination Interventions Activated:  Yes  Care Coordination Interventions:  Yes, provided   Follow up plan: No further intervention required.   Encounter Outcome:  Pt. Visit Completed   Lenor Derrick , MSW Social Worker [facility]  [phone]

## 2021-11-25 DIAGNOSIS — J020 Streptococcal pharyngitis: Secondary | ICD-10-CM | POA: Diagnosis not present

## 2021-12-26 ENCOUNTER — Telehealth: Payer: Self-pay | Admitting: Psychiatry

## 2021-12-26 NOTE — Telephone Encounter (Signed)
Pt called wanting to inform provider that she is still having pain/issues with her tail bone and that she thought it was possibly because of her having hemorrhoids but she has treated them with two boxes of Hemorrhoid treatment and has used the wipes but nothing has helped. Please advise.

## 2021-12-29 NOTE — Telephone Encounter (Signed)
Called patient who stated her tail bone pain is better but still there. She does spend a fair amount of time sitting, more painful with riding. Advised she look into buying a pillow to help take pressure off tail bone, shift her weight when sitting. She will try, verbalized understanding, appreciation.

## 2022-01-01 DIAGNOSIS — Z6825 Body mass index (BMI) 25.0-25.9, adult: Secondary | ICD-10-CM | POA: Diagnosis not present

## 2022-01-01 DIAGNOSIS — M797 Fibromyalgia: Secondary | ICD-10-CM | POA: Diagnosis not present

## 2022-01-01 DIAGNOSIS — E785 Hyperlipidemia, unspecified: Secondary | ICD-10-CM | POA: Diagnosis not present

## 2022-01-01 DIAGNOSIS — I10 Essential (primary) hypertension: Secondary | ICD-10-CM | POA: Diagnosis not present

## 2022-01-14 DIAGNOSIS — D485 Neoplasm of uncertain behavior of skin: Secondary | ICD-10-CM | POA: Diagnosis not present

## 2022-01-14 DIAGNOSIS — C44519 Basal cell carcinoma of skin of other part of trunk: Secondary | ICD-10-CM | POA: Diagnosis not present

## 2022-01-14 DIAGNOSIS — D1801 Hemangioma of skin and subcutaneous tissue: Secondary | ICD-10-CM | POA: Diagnosis not present

## 2022-01-14 DIAGNOSIS — L814 Other melanin hyperpigmentation: Secondary | ICD-10-CM | POA: Diagnosis not present

## 2022-01-14 DIAGNOSIS — L821 Other seborrheic keratosis: Secondary | ICD-10-CM | POA: Diagnosis not present

## 2022-01-21 DIAGNOSIS — L905 Scar conditions and fibrosis of skin: Secondary | ICD-10-CM | POA: Diagnosis not present

## 2022-01-21 DIAGNOSIS — C44519 Basal cell carcinoma of skin of other part of trunk: Secondary | ICD-10-CM | POA: Diagnosis not present

## 2022-02-12 DIAGNOSIS — R198 Other specified symptoms and signs involving the digestive system and abdomen: Secondary | ICD-10-CM | POA: Diagnosis not present

## 2022-02-12 DIAGNOSIS — K5900 Constipation, unspecified: Secondary | ICD-10-CM | POA: Diagnosis not present

## 2022-02-12 DIAGNOSIS — K644 Residual hemorrhoidal skin tags: Secondary | ICD-10-CM | POA: Diagnosis not present

## 2022-02-12 DIAGNOSIS — K648 Other hemorrhoids: Secondary | ICD-10-CM | POA: Diagnosis not present

## 2022-02-26 DIAGNOSIS — N393 Stress incontinence (female) (male): Secondary | ICD-10-CM | POA: Diagnosis not present

## 2022-02-26 DIAGNOSIS — N952 Postmenopausal atrophic vaginitis: Secondary | ICD-10-CM | POA: Diagnosis not present

## 2022-02-26 DIAGNOSIS — N3281 Overactive bladder: Secondary | ICD-10-CM | POA: Diagnosis not present

## 2022-03-17 DIAGNOSIS — K648 Other hemorrhoids: Secondary | ICD-10-CM | POA: Diagnosis not present

## 2022-03-17 DIAGNOSIS — K5900 Constipation, unspecified: Secondary | ICD-10-CM | POA: Diagnosis not present

## 2022-03-17 DIAGNOSIS — K644 Residual hemorrhoidal skin tags: Secondary | ICD-10-CM | POA: Diagnosis not present

## 2022-03-17 DIAGNOSIS — R198 Other specified symptoms and signs involving the digestive system and abdomen: Secondary | ICD-10-CM | POA: Diagnosis not present

## 2022-03-26 DIAGNOSIS — N3281 Overactive bladder: Secondary | ICD-10-CM | POA: Diagnosis not present

## 2022-03-26 DIAGNOSIS — N952 Postmenopausal atrophic vaginitis: Secondary | ICD-10-CM | POA: Diagnosis not present

## 2022-03-26 DIAGNOSIS — N393 Stress incontinence (female) (male): Secondary | ICD-10-CM | POA: Diagnosis not present

## 2022-04-01 DIAGNOSIS — Z800 Family history of malignant neoplasm of digestive organs: Secondary | ICD-10-CM | POA: Diagnosis not present

## 2022-04-01 DIAGNOSIS — K648 Other hemorrhoids: Secondary | ICD-10-CM | POA: Diagnosis not present

## 2022-04-01 DIAGNOSIS — K5901 Slow transit constipation: Secondary | ICD-10-CM | POA: Diagnosis not present

## 2022-04-01 DIAGNOSIS — Z86010 Personal history of colonic polyps: Secondary | ICD-10-CM | POA: Diagnosis not present

## 2022-04-01 DIAGNOSIS — R198 Other specified symptoms and signs involving the digestive system and abdomen: Secondary | ICD-10-CM | POA: Diagnosis not present

## 2022-04-01 DIAGNOSIS — K635 Polyp of colon: Secondary | ICD-10-CM | POA: Diagnosis not present

## 2022-04-01 DIAGNOSIS — K5730 Diverticulosis of large intestine without perforation or abscess without bleeding: Secondary | ICD-10-CM | POA: Diagnosis not present

## 2022-04-01 DIAGNOSIS — D122 Benign neoplasm of ascending colon: Secondary | ICD-10-CM | POA: Diagnosis not present

## 2022-04-01 DIAGNOSIS — Z1211 Encounter for screening for malignant neoplasm of colon: Secondary | ICD-10-CM | POA: Diagnosis not present

## 2022-05-20 DIAGNOSIS — K5900 Constipation, unspecified: Secondary | ICD-10-CM | POA: Diagnosis not present

## 2022-05-20 DIAGNOSIS — K648 Other hemorrhoids: Secondary | ICD-10-CM | POA: Diagnosis not present

## 2022-06-03 DIAGNOSIS — K648 Other hemorrhoids: Secondary | ICD-10-CM | POA: Diagnosis not present

## 2022-06-26 DIAGNOSIS — J3489 Other specified disorders of nose and nasal sinuses: Secondary | ICD-10-CM | POA: Diagnosis not present

## 2022-06-26 DIAGNOSIS — J309 Allergic rhinitis, unspecified: Secondary | ICD-10-CM | POA: Diagnosis not present

## 2022-06-26 DIAGNOSIS — G501 Atypical facial pain: Secondary | ICD-10-CM | POA: Diagnosis not present

## 2022-06-26 DIAGNOSIS — R0981 Nasal congestion: Secondary | ICD-10-CM | POA: Diagnosis not present

## 2022-06-26 DIAGNOSIS — J0100 Acute maxillary sinusitis, unspecified: Secondary | ICD-10-CM | POA: Diagnosis not present

## 2022-06-30 DIAGNOSIS — K648 Other hemorrhoids: Secondary | ICD-10-CM | POA: Diagnosis not present

## 2022-07-01 DIAGNOSIS — N393 Stress incontinence (female) (male): Secondary | ICD-10-CM | POA: Diagnosis not present

## 2022-07-01 DIAGNOSIS — N3281 Overactive bladder: Secondary | ICD-10-CM | POA: Diagnosis not present

## 2022-07-01 DIAGNOSIS — N952 Postmenopausal atrophic vaginitis: Secondary | ICD-10-CM | POA: Diagnosis not present

## 2022-07-14 ENCOUNTER — Encounter: Payer: Self-pay | Admitting: Psychiatry

## 2022-07-23 ENCOUNTER — Telehealth: Payer: Self-pay | Admitting: Psychiatry

## 2022-07-23 DIAGNOSIS — G8929 Other chronic pain: Secondary | ICD-10-CM | POA: Diagnosis not present

## 2022-07-23 DIAGNOSIS — M7551 Bursitis of right shoulder: Secondary | ICD-10-CM | POA: Diagnosis not present

## 2022-07-23 DIAGNOSIS — M546 Pain in thoracic spine: Secondary | ICD-10-CM | POA: Diagnosis not present

## 2022-07-23 DIAGNOSIS — M7918 Myalgia, other site: Secondary | ICD-10-CM | POA: Diagnosis not present

## 2022-07-23 DIAGNOSIS — M7552 Bursitis of left shoulder: Secondary | ICD-10-CM | POA: Diagnosis not present

## 2022-07-23 NOTE — Telephone Encounter (Signed)
Unable to reach pt over the phone, sent mychart msg informing pt of need to reschedule 10/22/22 appt - MD out

## 2022-07-28 DIAGNOSIS — K648 Other hemorrhoids: Secondary | ICD-10-CM | POA: Diagnosis not present

## 2022-08-04 DIAGNOSIS — M8588 Other specified disorders of bone density and structure, other site: Secondary | ICD-10-CM | POA: Diagnosis not present

## 2022-08-04 DIAGNOSIS — Z1231 Encounter for screening mammogram for malignant neoplasm of breast: Secondary | ICD-10-CM | POA: Diagnosis not present

## 2022-08-13 DIAGNOSIS — E785 Hyperlipidemia, unspecified: Secondary | ICD-10-CM | POA: Diagnosis not present

## 2022-08-13 DIAGNOSIS — L989 Disorder of the skin and subcutaneous tissue, unspecified: Secondary | ICD-10-CM | POA: Diagnosis not present

## 2022-08-13 DIAGNOSIS — Z6826 Body mass index (BMI) 26.0-26.9, adult: Secondary | ICD-10-CM | POA: Diagnosis not present

## 2022-08-13 DIAGNOSIS — Z139 Encounter for screening, unspecified: Secondary | ICD-10-CM | POA: Diagnosis not present

## 2022-08-13 DIAGNOSIS — I10 Essential (primary) hypertension: Secondary | ICD-10-CM | POA: Diagnosis not present

## 2022-08-13 DIAGNOSIS — I7090 Unspecified atherosclerosis: Secondary | ICD-10-CM | POA: Diagnosis not present

## 2022-08-18 DIAGNOSIS — D485 Neoplasm of uncertain behavior of skin: Secondary | ICD-10-CM | POA: Diagnosis not present

## 2022-08-18 DIAGNOSIS — C44619 Basal cell carcinoma of skin of left upper limb, including shoulder: Secondary | ICD-10-CM | POA: Diagnosis not present

## 2022-08-18 DIAGNOSIS — L570 Actinic keratosis: Secondary | ICD-10-CM | POA: Diagnosis not present

## 2022-08-31 DIAGNOSIS — Z01419 Encounter for gynecological examination (general) (routine) without abnormal findings: Secondary | ICD-10-CM | POA: Diagnosis not present

## 2022-08-31 DIAGNOSIS — M8588 Other specified disorders of bone density and structure, other site: Secondary | ICD-10-CM | POA: Diagnosis not present

## 2022-09-17 DIAGNOSIS — M7552 Bursitis of left shoulder: Secondary | ICD-10-CM | POA: Diagnosis not present

## 2022-09-17 DIAGNOSIS — G8929 Other chronic pain: Secondary | ICD-10-CM | POA: Diagnosis not present

## 2022-09-17 DIAGNOSIS — M25551 Pain in right hip: Secondary | ICD-10-CM | POA: Diagnosis not present

## 2022-09-17 DIAGNOSIS — M7551 Bursitis of right shoulder: Secondary | ICD-10-CM | POA: Diagnosis not present

## 2022-09-17 DIAGNOSIS — M1611 Unilateral primary osteoarthritis, right hip: Secondary | ICD-10-CM | POA: Diagnosis not present

## 2022-09-28 ENCOUNTER — Ambulatory Visit: Payer: Medicare PPO | Attending: Cardiology | Admitting: Cardiology

## 2022-09-28 ENCOUNTER — Encounter: Payer: Self-pay | Admitting: Cardiology

## 2022-09-28 VITALS — BP 130/88 | HR 54 | Ht 60.5 in | Wt 141.8 lb

## 2022-09-28 DIAGNOSIS — M19079 Primary osteoarthritis, unspecified ankle and foot: Secondary | ICD-10-CM | POA: Insufficient documentation

## 2022-09-28 DIAGNOSIS — E785 Hyperlipidemia, unspecified: Secondary | ICD-10-CM | POA: Diagnosis not present

## 2022-09-28 DIAGNOSIS — I2510 Atherosclerotic heart disease of native coronary artery without angina pectoris: Secondary | ICD-10-CM | POA: Insufficient documentation

## 2022-09-28 DIAGNOSIS — I2584 Coronary atherosclerosis due to calcified coronary lesion: Secondary | ICD-10-CM

## 2022-09-28 DIAGNOSIS — R0609 Other forms of dyspnea: Secondary | ICD-10-CM | POA: Insufficient documentation

## 2022-09-28 DIAGNOSIS — I7090 Unspecified atherosclerosis: Secondary | ICD-10-CM

## 2022-09-28 DIAGNOSIS — M2041 Other hammer toe(s) (acquired), right foot: Secondary | ICD-10-CM | POA: Insufficient documentation

## 2022-09-28 HISTORY — DX: Atherosclerotic heart disease of native coronary artery without angina pectoris: I25.10

## 2022-09-28 HISTORY — DX: Hyperlipidemia, unspecified: E78.5

## 2022-09-28 HISTORY — DX: Primary osteoarthritis, unspecified ankle and foot: M19.079

## 2022-09-28 MED ORDER — ASPIRIN 81 MG PO TBEC: 81.0000 mg | DELAYED_RELEASE_TABLET | Freq: Every day | ORAL | 3 refills | Status: DC

## 2022-09-28 NOTE — Patient Instructions (Addendum)
Medication Instructions:  Your physician has recommended you make the following change in your medication:   Start taking 81 mg coated aspirin daily.  *If you need a refill on your cardiac medications before your next appointment, please call your pharmacy*   Lab Work: None ordered If you have labs (blood work) drawn today and your tests are completely normal, you will receive your results only by: MyChart Message (if you have MyChart) OR A paper copy in the mail If you have any lab test that is abnormal or we need to change your treatment, we will call you to review the results.   Testing/Procedures: You are scheduled for a Myocardial Perfusion Imaging Study.  Please arrive 15 minutes prior to your appointment time for registration and insurance purposes.  The test will take approximately 3 to 4 hours to complete; you may bring reading material.  If someone comes with you to your appointment, they will need to remain in the main lobby due to limited space in the testing area.   How to prepare for your Myocardial Perfusion Test: Do not eat or drink 3 hours prior to your test, except you may have water. Do not consume products containing caffeine (regular or decaffeinated) 12 hours prior to your test. (ex: coffee, chocolate, sodas, tea). Do bring a list of your current medications with you.  If not listed below, you may take your medications as normal. Do wear comfortable clothes (no dresses or overalls) and walking shoes, tennis shoes preferred (No heels or open toe shoes are allowed). Do NOT wear cologne, perfume, aftershave, or lotions (deodorant is allowed). If these instructions are not followed, your test will have to be rescheduled.  If you cannot keep your appointment, please provide 24 hours notification to the Nuclear Lab, to avoid a possible $50 charge to your account.  Follow-Up: At [facility], you and your health needs are our priority.  As part of our  continuing mission to provide you with exceptional heart care, we have created designated Provider Care Teams.  These Care Teams include your primary Cardiologist (physician) and Advanced Practice Providers (APPs -  Physician Assistants and Nurse Practitioners) who all work together to provide you with the care you need, when you need it.  We recommend signing up for the patient portal called "MyChart".  Sign up information is provided on this After Visit Summary.  MyChart is used to connect with patients for Virtual Visits (Telemedicine).  Patients are able to view lab/test results, encounter notes, upcoming appointments, etc.  Non-urgent messages can be sent to your provider as well.   To learn more about what you can do with MyChart, go to [email or web address].    Your next appointment:   3 month(s)  Provider:   Gypsy Balsam, MD   Other Instructions  Cardiac Nuclear Scan A cardiac nuclear scan is a test that is done to check the flow of blood to your heart. It is done when you are resting and when you are exercising. The test looks for problems such as: Not enough blood reaching a portion of the heart. The heart muscle not working as it should. You may need this test if you have: Heart disease. Lab results that are not normal. Had heart surgery or a balloon procedure to open up blocked arteries (angioplasty) or a small mesh tube (stent). Chest pain. Shortness of breath. Had a heart attack. In this test, a special dye (tracer) is put into your bloodstream. The tracer will  travel to your heart. A camera will then take pictures of your heart to see how the tracer moves through your heart. This test is usually done at a hospital and takes 2-4 hours. Tell a doctor about: Any allergies you have. All medicines you are taking, including vitamins, herbs, eye drops, creams, and over-the-counter medicines. Any bleeding problems you have. Any surgeries you have had. Any medical conditions  you have. Whether you are pregnant or may be pregnant. Any history of asthma or long-term (chronic) lung disease. Any history of heart rhythm disorders or heart valve conditions. What are the risks? Your doctor will talk with you about risks. These may include: Serious chest pain and heart attack. This is only a risk if the stress portion of the test is done. Fast or uneven heartbeats (palpitations). A feeling of warmth in your chest. This feeling usually does not last long. Allergic reaction to the tracer. Shortness of breath or trouble breathing. What happens before the test? Ask your doctor about changing or stopping your normal medicines. Follow instructions from your doctor about what you cannot eat or drink. Remove your jewelry on the day of the test. Ask your doctor if you need to avoid nicotine or caffeine. What happens during the test? An IV tube will be inserted into one of your veins. Your doctor will give you a small amount of tracer through the IV tube. You will wait for 20-40 minutes while the tracer moves through your bloodstream. Your heart will be monitored with an electrocardiogram (ECG). You will lie down on an exam table. Pictures of your heart will be taken for about 15-20 minutes. You may also have a stress test. For this test, one of these things may be done: You will be asked to exercise on a treadmill or a stationary bike. You will be given medicines that will make your heart work harder. This is done if you are unable to exercise. When blood flow to your heart has peaked, a tracer will again be given through the IV tube. After 20-40 minutes, you will get back on the exam table. More pictures will be taken of your heart. Depending on the tracer that is used, more pictures may need to be taken 3-4 hours later. Your IV tube will be removed when the test is over. The test may vary among doctors and hospitals. What happens after the test? Ask your doctor: Whether  you can return to your normal schedule, including diet, activities, travel, and medicines. Whether you should drink more fluids. This will help to remove the tracer from your body. Ask your doctor, or the department that is doing the test: When will my results be ready? How will I get my results? What are my treatment options? What other tests do I need? What are my next steps? This information is not intended to replace advice given to you by your health care provider. Make sure you discuss any questions you have with your health care provider. Document Revised: 07/22/2021 Document Reviewed: 07/22/2021 Elsevier Patient Education  2023 [employer].

## 2022-09-28 NOTE — Progress Notes (Signed)
Cardiology Consultation:    Date:  09/28/2022   ID:  Stephanie Macdonald, DOB 11-Nov-1940, MRN [id number]  PCP:  Wilmer Floor., MD  Cardiologist:  Gypsy Balsam, MD   Referring MD: Wilmer Floor., MD   Chief Complaint  Patient presents with   Abnomal CT calcium score    History of Present Illness:    Stephanie Macdonald is a 82 y.o. female who is being seen today for the evaluation of calcification of the arteries at the request of Wilmer Floor., MD. past medical history significant for dyslipidemia recently increased dose of her statin, she never smoked does not have family history of premature coronary artery disease she was referred to as because she had mammogram done and apparently there is some new technique that mammogram artificial intelligence check for any calcification of the artery and that test has being abnormal however I do not have official report of that portion of the test.  She comes today to be established and evaluated for this issue overall she seems to be doing quite well.  She denies have any chest pain tightness squeezing pressure burning chest.  She is trying to be active she walks on the regular basis she is planning to go to [place] in a month and she is trying to build up stamina to be ready for a trip.  She never had any heart trouble she does not have any family history of coronary artery disease she does have dyslipidemia which is successfully managed and now recently her cholesterol lowering medication has being increased after she was discovered to have calcification of the artery on the mammogram.  Last LDL I have is from June of this year with LDL of 127 HDL 87.  Past Medical History:  Diagnosis Date   Arthritis    rheumatoid   Benign hypertension    Diabetes mellitus without complication (HCC)    Fibromyalgia    Hyperlipidemia    Lumbar radiculopathy, acute    Osteopenia     Past Surgical History:  Procedure Laterality Date    APPENDECTOMY     BREAST ENHANCEMENT SURGERY     CATARACT EXTRACTION Bilateral    HAND SURGERY     PLANTAR FASCIA SURGERY     skin lesions     TUBAL LIGATION      Current Medications: Current Meds  Medication Sig   ACETAMINOPHEN ER PO Take 1 tablet by mouth as needed (pain).   alendronate (FOSAMAX) 70 MG tablet Take 70 mg by mouth once a week. Take with a full glass of water on an empty stomach.   aspirin EC 81 MG tablet Take 1 tablet (81 mg total) by mouth daily. Swallow whole.   cyanocobalamin (VITAMIN B12) 1000 MCG tablet Take 3,000 mcg by mouth daily.   fesoterodine (TOVIAZ) 4 MG TB24 tablet Take 8 mg by mouth daily.   gabapentin (NEURONTIN) 600 MG tablet Take 1200 mg in the morning, 600 mg in the afternoon, and 1200 mg at night (Patient taking differently: Take 600 mg by mouth See admin instructions. Take 1200 mg in the morning, 600 mg in the afternoon, and 1200 mg at night)   losartan (COZAAR) 100 MG tablet Take 100 mg by mouth daily.   OVER THE COUNTER MEDICATION Take 3 tablets by mouth daily. Bone Strength Plant Base Calcium   OVER THE COUNTER MEDICATION Take 2 tablets by mouth 2 (two) times daily as needed (patient decides).   rosuvastatin (CRESTOR) 40 MG tablet Take  5 mg by mouth daily.   [DISCONTINUED] BIOTIN PO Take 1 tablet by mouth daily.   [DISCONTINUED] fexofenadine (ALLEGRA) 60 MG tablet Take 60 mg by mouth 2 (two) times daily.   [DISCONTINUED] fluticasone (FLONASE) 50 MCG/ACT nasal spray Place 1 spray into both nostrils daily.   [DISCONTINUED] ondansetron (ZOFRAN) 4 MG tablet Take 1 tablet (4 mg total) by mouth every 8 (eight) hours as needed for nausea or vomiting.   [DISCONTINUED] polyethylene glycol (MIRALAX / GLYCOLAX) packet Take 17 g by mouth daily as needed for mild constipation or moderate constipation.   [DISCONTINUED] White Petrolatum-Mineral Oil (ARTIFICIAL TEARS) ointment Apply 1 drop to eye as needed (Eye dryness).   Current Facility-Administered Medications  for the 09/28/22 encounter (Office Visit) with Georgeanna Lea, MD  Medication   triamcinolone acetonide (KENALOG) 10 MG/ML injection 10 mg     Allergies:   Codeine, Cymbalta [duloxetine hcl], Diclofenac-misoprostol, Duloxetine, Hydrocodone, Hydrocodone-acetaminophen, Metaxalone, Other, Tramadol, and Venlafaxine   Social History   Socioeconomic History   Marital status: Widowed    Spouse name: Annette Stable   Number of children: 3   Years of education: Not on file   Highest education level: Bachelor's degree (e.g., BA, AB, BS)  Occupational History   Not on file  Tobacco Use   Smoking status: Never   Smokeless tobacco: Never  Substance and Sexual Activity   Alcohol use: Never   Drug use: Never   Sexual activity: Not on file  Other Topics Concern   Not on file  Social History Narrative   Right handed    Some caffeine    Lives at home alone   Social Determinants of Health   Financial Resource Strain: Not on file  Food Insecurity: No Food Insecurity (11/17/2021)   Hunger Vital Sign    Worried About Running Out of Food in the Last Year: Never true    Ran Out of Food in the Last Year: Never true  Transportation Needs: No Transportation Needs (11/17/2021)   PRAPARE - Administrator, Civil Service (Medical): No    Lack of Transportation (Non-Medical): No  Physical Activity: Not on file  Stress: Not on file  Social Connections: Not on file     Family History: The patient's family history includes Cancer in her father; Colon cancer in her mother; Heart attack in her maternal grandfather and paternal grandfather; Hypertension in her mother; Skin cancer in her father. ROS:   Please see the history of present illness.    All 14 point review of systems negative except as described per history of present illness.  EKGs/Labs/Other Studies Reviewed:    The following studies were reviewed today:   EKG:     Sinus bradycardia, normal P interval, normal QS complex duration  fulgent right axis deviation.  Recent Labs: No results found for requested labs within last 365 days.  Recent Lipid Panel No results found for: "CHOL", "TRIG", "HDL", "CHOLHDL", "VLDL", "LDLCALC", "LDLDIRECT"  Physical Exam:    VS:  BP 130/88 (BP Location: Left Arm, Patient Position: Sitting)   Pulse (!) 54   Ht 5' 0.5" (1.537 m)   Wt 141 lb 12.8 oz (64.3 kg)   LMP  (LMP Unknown)   SpO2 95%   BMI 27.24 kg/m     Wt Readings from Last 3 Encounters:  09/28/22 141 lb 12.8 oz (64.3 kg)  10/16/21 131 lb 3.2 oz (59.5 kg)  04/16/21 131 lb 4 oz (59.5 kg)     GEN:  Well nourished, well developed in no acute distress HEENT: Normal NECK: No JVD; No carotid bruits LYMPHATICS: No lymphadenopathy CARDIAC: RRR, no murmurs, no rubs, no gallops RESPIRATORY:  Clear to auscultation without rales, wheezing or rhonchi  ABDOMEN: Soft, non-tender, non-distended MUSCULOSKELETAL:  No edema; No deformity  SKIN: Warm and dry NEUROLOGIC:  Alert and oriented x 3 PSYCHIATRIC:  Normal affect   ASSESSMENT:    1. Calcification of artery   2. Dyspnea on exertion   3. Coronary artery disease due to calcified coronary lesion   4. Dyslipidemia    PLAN:    In order of problems listed above:  Calcification noted in the artery during the mammogram.  We had a long discussion about significance of this finding.  I recommend to start antiplatelets therapy in form of aspirin, I also asked her to do stress test to make sure she does have an obstructive disease based on the story however the fact that she is able to walk and climb stairs with no major difficulties then I think she will be quite fine. Dyslipidemia recently for cholesterol lowering medication has been increased.  Will wait for results of repeated fasting lipid profile. Dyspnea on exertion overall she is doing quite well exercising I encouraged her to keep exercising   Medication Adjustments/Labs and Tests Ordered: Current medicines are reviewed  at length with the patient today.  Concerns regarding medicines are outlined above.  Orders Placed This Encounter  Procedures   MYOCARDIAL PERFUSION IMAGING   EKG 12-Lead   Meds ordered this encounter  Medications   aspirin EC 81 MG tablet    Sig: Take 1 tablet (81 mg total) by mouth daily. Swallow whole.    Dispense:  90 tablet    Refill:  3    Signed, Georgeanna Lea, MD, [facility]. 09/28/2022 11:46 AM    [facility]

## 2022-09-29 ENCOUNTER — Telehealth (HOSPITAL_COMMUNITY): Payer: Self-pay | Admitting: *Deleted

## 2022-09-29 DIAGNOSIS — C44619 Basal cell carcinoma of skin of left upper limb, including shoulder: Secondary | ICD-10-CM | POA: Diagnosis not present

## 2022-09-29 NOTE — Telephone Encounter (Signed)
Patient given detailed instructions per Myocardial Perfusion Study Information Sheet for the test on 10/01/22 Patient notified to arrive 15 minutes early and that it is imperative to arrive on time for appointment to keep from having the test rescheduled.  If you need to cancel or reschedule your appointment, please call the office within 24 hours of your appointment. . Patient verbalized understanding. Ricky Ala

## 2022-10-01 ENCOUNTER — Ambulatory Visit: Payer: Medicare PPO | Attending: Cardiology

## 2022-10-01 VITALS — Ht 60.0 in | Wt 141.0 lb

## 2022-10-01 DIAGNOSIS — I7090 Unspecified atherosclerosis: Secondary | ICD-10-CM

## 2022-10-01 LAB — MYOCARDIAL PERFUSION IMAGING
LV dias vol: 62 mL (ref 46–106)
LV sys vol: 25 mL
Nuc Stress EF: 59 %
Peak HR: 79 {beats}/min
Rest HR: 56 {beats}/min
Rest Nuclear Isotope Dose: 10.6 mCi
SDS: 3
SRS: 2
SSS: 5
ST Depression (mm): 0 mm
Stress Nuclear Isotope Dose: 31.2 mCi
TID: 1.18

## 2022-10-01 MED ORDER — TECHNETIUM TC 99M TETROFOSMIN IV KIT
10.6000 | PACK | Freq: Once | INTRAVENOUS | Status: AC | PRN
  Administered 2022-10-01: 10.6 via INTRAVENOUS

## 2022-10-01 MED ORDER — REGADENOSON 0.4 MG/5ML IV SOLN
0.4000 mg | Freq: Once | INTRAVENOUS | Status: AC
Start: 2022-10-01 — End: 2022-10-01
  Administered 2022-10-01: 0.4 mg via INTRAVENOUS

## 2022-10-01 MED ORDER — TECHNETIUM TC 99M TETROFOSMIN IV KIT
31.2000 | PACK | Freq: Once | INTRAVENOUS | Status: AC | PRN
  Administered 2022-10-01: 31.2 via INTRAVENOUS

## 2022-10-02 ENCOUNTER — Telehealth: Payer: Self-pay

## 2022-10-02 NOTE — Telephone Encounter (Signed)
Appt to discuss test results 10-06-22

## 2022-10-06 ENCOUNTER — Ambulatory Visit: Payer: Medicare PPO | Attending: Cardiology | Admitting: Cardiology

## 2022-10-06 ENCOUNTER — Encounter: Payer: Self-pay | Admitting: Cardiology

## 2022-10-06 VITALS — BP 130/78 | HR 54 | Ht 60.5 in | Wt 138.8 lb

## 2022-10-06 DIAGNOSIS — I2510 Atherosclerotic heart disease of native coronary artery without angina pectoris: Secondary | ICD-10-CM | POA: Diagnosis not present

## 2022-10-06 DIAGNOSIS — R9439 Abnormal result of other cardiovascular function study: Secondary | ICD-10-CM

## 2022-10-06 DIAGNOSIS — R072 Precordial pain: Secondary | ICD-10-CM

## 2022-10-06 DIAGNOSIS — E785 Hyperlipidemia, unspecified: Secondary | ICD-10-CM

## 2022-10-06 DIAGNOSIS — I2584 Coronary atherosclerosis due to calcified coronary lesion: Secondary | ICD-10-CM

## 2022-10-06 HISTORY — DX: Abnormal result of other cardiovascular function study: R94.39

## 2022-10-06 NOTE — Progress Notes (Unsigned)
Cardiology Office Note:    Date:  10/06/2022   ID:  Stephanie Macdonald, DOB 07-Jan-1941, MRN [id number]  PCP:  Wilmer Floor., MD  Cardiologist:  Gypsy Balsam, MD    Referring MD: Wilmer Floor., MD   Chief Complaint  Patient presents with   Results    Stress Tess    History of Present Illness:    Stephanie Macdonald is a 82 y.o. female with past medical history significant for calcification of the arteries that apparently was fine and her breast, dyslipidemia she is not a smoker does not have family history of premature coronary artery disease but because of finding of calcified arteries stress test has been done.  Stress test showed potential ischemia in the LAD territory she comes today to talk about this.  Interestingly she is asymptomatic she is getting ready for her trip to [place] which will be on 19th of this month and she tried to exercise on the regular basis walking in the mall doing 4 events of the day with no difficulties she get tired really short of breath but otherwise no chest pain tightness squeezing pressure burning chest.  I brought her to my office to talk about options for this situation  Past Medical History:  Diagnosis Date   Arthritis    rheumatoid   Benign hypertension    Diabetes mellitus without complication (HCC)    Fibromyalgia    Hyperlipidemia    Lumbar radiculopathy, acute    Osteopenia     Past Surgical History:  Procedure Laterality Date   APPENDECTOMY     BREAST ENHANCEMENT SURGERY     CATARACT EXTRACTION Bilateral    HAND SURGERY     PLANTAR FASCIA SURGERY     skin lesions     TUBAL LIGATION      Current Medications: Current Meds  Medication Sig   ACETAMINOPHEN ER PO Take 1 tablet by mouth as needed (pain).   alendronate (FOSAMAX) 70 MG tablet Take 70 mg by mouth once a week. Take with a full glass of water on an empty stomach.   aspirin EC 81 MG tablet Take 1 tablet (81 mg total) by mouth daily. Swallow whole.    cyanocobalamin (VITAMIN B12) 1000 MCG tablet Take 3,000 mcg by mouth daily.   fesoterodine (TOVIAZ) 4 MG TB24 tablet Take 8 mg by mouth daily.   gabapentin (NEURONTIN) 600 MG tablet Take 1200 mg in the morning, 600 mg in the afternoon, and 1200 mg at night (Patient taking differently: Take 600 mg by mouth See admin instructions. Take 1200 mg in the morning, 600 mg in the afternoon, and 1200 mg at night)   losartan (COZAAR) 100 MG tablet Take 100 mg by mouth daily.   OVER THE COUNTER MEDICATION Take 3 tablets by mouth daily. Bone Strength Plant Base Calcium   OVER THE COUNTER MEDICATION Take 2 tablets by mouth 2 (two) times daily as needed (patient decides).   rosuvastatin (CRESTOR) 40 MG tablet Take 5 mg by mouth daily.   Current Facility-Administered Medications for the 10/06/22 encounter (Office Visit) with Georgeanna Lea, MD  Medication   triamcinolone acetonide (KENALOG) 10 MG/ML injection 10 mg     Allergies:   Codeine, Cymbalta [duloxetine hcl], Diclofenac-misoprostol, Duloxetine, Hydrocodone, Hydrocodone-acetaminophen, Metaxalone, Other, Tramadol, and Venlafaxine   Social History   Socioeconomic History   Marital status: Widowed    Spouse name: Annette Stable   Number of children: 3   Years of education: Not on file  Highest education level: Bachelor's degree (e.g., BA, AB, BS)  Occupational History   Not on file  Tobacco Use   Smoking status: Never   Smokeless tobacco: Never  Substance and Sexual Activity   Alcohol use: Never   Drug use: Never   Sexual activity: Not on file  Other Topics Concern   Not on file  Social History Narrative   Right handed    Some caffeine    Lives at home alone   Social Determinants of Health   Financial Resource Strain: Not on file  Food Insecurity: No Food Insecurity (11/17/2021)   Hunger Vital Sign    Worried About Running Out of Food in the Last Year: Never true    Ran Out of Food in the Last Year: Never true  Transportation Needs: No  Transportation Needs (11/17/2021)   PRAPARE - Administrator, Civil Service (Medical): No    Lack of Transportation (Non-Medical): No  Physical Activity: Not on file  Stress: Not on file  Social Connections: Not on file     Family History: The patient's family history includes Cancer in her father; Colon cancer in her mother; Heart attack in her maternal grandfather and paternal grandfather; Hypertension in her mother; Skin cancer in her father. ROS:   Please see the history of present illness.    All 14 point review of systems negative except as described per history of present illness  EKGs/Labs/Other Studies Reviewed:         Recent Labs: No results found for requested labs within last 365 days.  Recent Lipid Panel No results found for: "CHOL", "TRIG", "HDL", "CHOLHDL", "VLDL", "LDLCALC", "LDLDIRECT"  Physical Exam:    VS:  BP 130/78 (BP Location: Left Arm, Patient Position: Sitting)   Pulse (!) 54   Ht 5' 0.5" (1.537 m)   Wt 138 lb 12.8 oz (63 kg)   LMP  (LMP Unknown)   SpO2 93%   BMI 26.66 kg/m     Wt Readings from Last 3 Encounters:  10/06/22 138 lb 12.8 oz (63 kg)  10/01/22 141 lb (64 kg)  09/28/22 141 lb 12.8 oz (64.3 kg)     GEN:  Well nourished, well developed in no acute distress HEENT: Normal NECK: No JVD; No carotid bruits LYMPHATICS: No lymphadenopathy CARDIAC: RRR, no murmurs, no rubs, no gallops RESPIRATORY:  Clear to auscultation without rales, wheezing or rhonchi  ABDOMEN: Soft, non-tender, non-distended MUSCULOSKELETAL:  No edema; No deformity  SKIN: Warm and dry LOWER EXTREMITIES: no swelling NEUROLOGIC:  Alert and oriented x 3 PSYCHIATRIC:  Normal affect   ASSESSMENT:    1. Abnormal stress test   2. Coronary artery disease due to calcified coronary lesion   3. Dyslipidemia    PLAN:    In order of problems listed above:  Abnormal stress test showing possibility of LAD ischemia but late is completely asymptomatic with her  ability to exercise she is doing well.  We had a long discussion what to do with the situation of course would complicate this a lot is the fact that she does have some shortness of breath with exertion and she is planning to go to [place] on 19th of this month we discussed option of either medical therapy versus coronary CT angio versus cardiac catheterization.  She told me right away she does not want to do any invasive procedure because before the trip so we elected to pursue a coronary CT angio to see if she Truly has  stenosis in the LAD.  I explained procedure to her we will proceed.  I am worried that her dyspnea exertion could be anginal equivalent. Dyslipidemia.  I do have data from June with HDL 87 LDL 127 and honestly I did not expect her to have significant coronary disease before we ordered the test but now we have abnormal stress test need to be addressed.  She is on rosuvastatin 40 which I will continue with nitroglycerin.   Medication Adjustments/Labs and Tests Ordered: Current medicines are reviewed at length with the patient today.  Concerns regarding medicines are outlined above.  No orders of the defined types were placed in this encounter.  Medication changes: No orders of the defined types were placed in this encounter.   Signed, Georgeanna Lea, MD, [facility] 10/06/2022 4:18 PM    [facility]

## 2022-10-06 NOTE — Patient Instructions (Signed)
Medication Instructions:  No Medication changes *If you need a refill on your cardiac medications before your next appointment, please call your pharmacy*   Lab Work: BMP - today If you have labs (blood work) drawn today and your tests are completely normal, you will receive your results only by: MyChart Message (if you have MyChart) OR A paper copy in the mail If you have any lab test that is abnormal or we need to change your treatment, we will call you to review the results.   Testing/Procedures: Your physician has requested that you have cardiac CT. Cardiac computed tomography (CT) is a painless test that uses an x-ray machine to take clear, detailed pictures of your heart. For further information please visit [email or web address] Please follow instruction sheet as given.    Your Cardiac CT will be scheduled at:   [facility] located off [facility] at the hospital.  Please arrive 30 minutes prior to your appointment time.  You can use the FREE valet parking offered at entrance to outpatient center (encouraged to control the heart rate for the test)   Please follow these instructions carefully (unless otherwise directed):    On the Night Before the Test: Be sure to Drink plenty of water. Do not consume any caffeinated/decaffeinated beverages or chocolate 12 hours prior to your test. Do not take any antihistamines 12 hours prior to your test.   On the Day of the Test: Drink plenty of water until 1 hour prior to the test. Do not eat any food 4 hours prior to the test. No smoking 4 hours prior to test. You may take your regular medications prior to the test.  FEMALES- please wear underwire-free bra if available, avoid dresses & tight clothing. Wear plain shirt no beads, sparkles, rhinestones, metal or heavy embroidery.  After the Test: Drink plenty of water. After receiving IV contrast, you may experience a mild flushed feeling. This is normal. On  occasion, you may experience a mild rash up to 24 hours after the test. This is not dangerous. If this occurs, you can take Benadryl 25 mg and increase your fluid intake. If you experience trouble breathing, this can be serious. If it is severe call 911 IMMEDIATELY. If it is mild, please call our office. If you take any of these medications: Glipizide/Metformin, Avandament, Glucavance, please do not take 48 hours after completing test unless otherwise instructed.  We will call to schedule your test 2-4 weeks out understanding that some insurance companies will need an authorization prior to the service being performed.      Follow-Up: At [facility], you and your health needs are our priority.  As part of our continuing mission to provide you with exceptional heart care, we have created designated Provider Care Teams.  These Care Teams include your primary Cardiologist (physician) and Advanced Practice Providers (APPs -  Physician Assistants and Nurse Practitioners) who all work together to provide you with the care you need, when you need it.  We recommend signing up for the patient portal called "MyChart".  Sign up information is provided on this After Visit Summary.  MyChart is used to connect with patients for Virtual Visits (Telemedicine).  Patients are able to view lab/test results, encounter notes, upcoming appointments, etc.  Non-urgent messages can be sent to your provider as well.   To learn more about what you can do with MyChart, go to [email or web address].    Your next appointment:   3 month(s)  The  format for your next appointment:   In Person  Provider:   Gypsy Balsam, MD    Other Instructions Cardiac CT Angiogram A cardiac CT angiogram is a procedure to look at the heart and the area around the heart. It may be done to help find the cause of chest pains or other symptoms of heart disease. During this procedure, a substance called contrast dye is injected into  the blood vessels in the area to be checked. A large X-ray machine, called a CT scanner, then takes detailed pictures of the heart and the surrounding area. The procedure is also sometimes called a coronary CT angiogram, coronary artery scanning, or CTA. A cardiac CT angiogram allows the health care provider to see how well blood is flowing to and from the heart. The health care provider will be able to see if there are any problems, such as: Blockage or narrowing of the coronary arteries in the heart. Fluid around the heart. Signs of weakness or disease in the muscles, valves, and tissues of the heart. Tell a health care provider about: Any allergies you have. This is especially important if you have had a previous allergic reaction to contrast dye. All medicines you are taking, including vitamins, herbs, eye drops, creams, and over-the-counter medicines. Any blood disorders you have. Any surgeries you have had. Any medical conditions you have. Whether you are pregnant or may be pregnant. Any anxiety disorders, chronic pain, or other conditions you have that may increase your stress or prevent you from lying still. What are the risks? Generally, this is a safe procedure. However, problems may occur, including: Bleeding. Infection. Allergic reactions to medicines or dyes. Damage to other structures or organs. Kidney damage from the contrast dye that is used. Increased risk of cancer from radiation exposure. This risk is low. Talk with your health care provider about: The risks and benefits of testing. How you can receive the lowest dose of radiation. What happens before the procedure? Wear comfortable clothing and remove any jewelry, glasses, dentures, and hearing aids. Follow instructions from your health care provider about eating and drinking. This may include: For 12 hours before the procedure -- avoid caffeine. This includes tea, coffee, soda, energy drinks, and diet pills. Drink  plenty of water or other fluids that do not have caffeine in them. Being well hydrated can prevent complications. For 4-6 hours before the procedure -- stop eating and drinking. The contrast dye can cause nausea, but this is less likely if your stomach is empty. Ask your health care provider about changing or stopping your regular medicines. This is especially important if you are taking diabetes medicines, blood thinners, or medicines to treat problems with erections (erectile dysfunction). What happens during the procedure?  Hair on your chest may need to be removed so that small sticky patches called electrodes can be placed on your chest. These will transmit information that helps to monitor your heart during the procedure. An IV will be inserted into one of your veins. You might be given a medicine to control your heart rate during the procedure. This will help to ensure that good images are obtained. You will be asked to lie on an exam table. This table will slide in and out of the CT machine during the procedure. Contrast dye will be injected into the IV. You might feel warm, or you may get a metallic taste in your mouth. You will be given a medicine called nitroglycerin. This will relax or dilate the  arteries in your heart. The table that you are lying on will move into the CT machine tunnel for the scan. The person running the machine will give you instructions while the scans are being done. You may be asked to: Keep your arms above your head. Hold your breath. Stay very still, even if the table is moving. When the scanning is complete, you will be moved out of the machine. The IV will be removed. The procedure may vary among health care providers and hospitals. What can I expect after the procedure? After your procedure, it is common to have: A metallic taste in your mouth from the contrast dye. A feeling of warmth. A headache from the nitroglycerin. Follow these instructions at  home: Take over-the-counter and prescription medicines only as told by your health care provider. If you are told, drink enough fluid to keep your urine pale yellow. This will help to flush the contrast dye out of your body. Most people can return to their normal activities right after the procedure. Ask your health care provider what activities are safe for you. It is up to you to get the results of your procedure. Ask your health care provider, or the department that is doing the procedure, when your results will be ready. Keep all follow-up visits as told by your health care provider. This is important. Contact a health care provider if: You have any symptoms of allergy to the contrast dye. These include: Shortness of breath. Rash or hives. A racing heartbeat. Summary A cardiac CT angiogram is a procedure to look at the heart and the area around the heart. It may be done to help find the cause of chest pains or other symptoms of heart disease. During this procedure, a large X-ray machine, called a CT scanner, takes detailed pictures of the heart and the surrounding area after a contrast dye has been injected into blood vessels in the area. Ask your health care provider about changing or stopping your regular medicines before the procedure. This is especially important if you are taking diabetes medicines, blood thinners, or medicines to treat erectile dysfunction. If you are told, drink enough fluid to keep your urine pale yellow. This will help to flush the contrast dye out of your body. This information is not intended to replace advice given to you by your health care provider. Make sure you discuss any questions you have with your health care provider. Document Revised: 06/12/2021 Document Reviewed: 10/19/2018 Elsevier Patient Education  2023 Elsevier Inc.   Important Information About Sugar

## 2022-10-13 DIAGNOSIS — R079 Chest pain, unspecified: Secondary | ICD-10-CM | POA: Diagnosis not present

## 2022-10-13 DIAGNOSIS — R072 Precordial pain: Secondary | ICD-10-CM | POA: Diagnosis not present

## 2022-10-19 ENCOUNTER — Telehealth: Payer: Self-pay | Admitting: Cardiology

## 2022-10-19 NOTE — Telephone Encounter (Signed)
Advised that results are not available at this time. Pt states that CT was done at [facility] on 10/13/22.

## 2022-10-19 NOTE — Telephone Encounter (Signed)
Patient is requesting call back to discuss CT results. Needing call back to get update on when CT results will be ready. Please advise.  She states you can leave detailed message on VM.

## 2022-10-22 ENCOUNTER — Ambulatory Visit: Payer: Medicare PPO | Admitting: Psychiatry

## 2022-10-22 ENCOUNTER — Telehealth: Payer: Self-pay | Admitting: Cardiology

## 2022-10-22 ENCOUNTER — Encounter: Payer: Self-pay | Admitting: Cardiology

## 2022-10-22 NOTE — Telephone Encounter (Signed)
Spoke with pt regarding CT angio results per Dr. Bing Matter. She verbalized understanding and had no further questions.

## 2022-10-22 NOTE — Telephone Encounter (Signed)
Follow Ip:     Patient says she would like her results from her test, that she had on 10-13-22 please.

## 2022-10-23 DIAGNOSIS — M5416 Radiculopathy, lumbar region: Secondary | ICD-10-CM | POA: Diagnosis not present

## 2022-11-05 DIAGNOSIS — R55 Syncope and collapse: Secondary | ICD-10-CM | POA: Diagnosis not present

## 2022-11-05 DIAGNOSIS — U071 COVID-19: Secondary | ICD-10-CM | POA: Diagnosis not present

## 2022-11-05 DIAGNOSIS — R0602 Shortness of breath: Secondary | ICD-10-CM | POA: Diagnosis not present

## 2022-11-05 DIAGNOSIS — I10 Essential (primary) hypertension: Secondary | ICD-10-CM | POA: Diagnosis not present

## 2022-11-12 DIAGNOSIS — Z0000 Encounter for general adult medical examination without abnormal findings: Secondary | ICD-10-CM | POA: Diagnosis not present

## 2022-11-12 DIAGNOSIS — Z9181 History of falling: Secondary | ICD-10-CM | POA: Diagnosis not present

## 2022-11-25 ENCOUNTER — Encounter: Payer: Self-pay | Admitting: Psychiatry

## 2022-11-25 ENCOUNTER — Ambulatory Visit: Payer: Medicare PPO | Admitting: Psychiatry

## 2022-11-25 VITALS — BP 149/82 | HR 65 | Ht 60.0 in | Wt 147.0 lb

## 2022-11-25 DIAGNOSIS — R413 Other amnesia: Secondary | ICD-10-CM | POA: Diagnosis not present

## 2022-11-25 DIAGNOSIS — M5416 Radiculopathy, lumbar region: Secondary | ICD-10-CM | POA: Diagnosis not present

## 2022-11-25 MED ORDER — GABAPENTIN 600 MG PO TABS: ORAL_TABLET | ORAL | 6 refills | Status: DC

## 2022-11-25 NOTE — Progress Notes (Signed)
   CC:  memory loss, leg pain  Follow-up Visit  Last visit: 10/16/21  Brief HPI: 82 year old female who follows in clinic for memory loss and lumbar radiculopathy.  At her last visit she was continued on gabapentin for back and leg pain.  Interval History: Memory is stable. She continues to perform her ADLs independently including driving, managing medications, cooking, shopping, and cleaning.   Continues to have back pain but gabapentin helps reduce the severity. Had a lumbar epidural injection last month which helped a little. She is able to walk at least 2 miles at a time without issues.  No recurrence of vertigo since her last visit.    Physical Exam:   Vital Signs: LMP  (LMP Unknown)  GENERAL:  well appearing, in no acute distress, alert  SKIN:  Color, texture, turgor normal. No rashes or lesions HEAD:  Normocephalic/atraumatic. RESP: normal respiratory effort  NEUROLOGICAL: Mental Status:     11/25/2022   10:27 AM 10/16/2021   10:40 AM  Montreal Cognitive Assessment   Visuospatial/ Executive (0/5) 4 3  Naming (0/3) 3 3  Attention: Read list of digits (0/2) 2 1  Attention: Read list of letters (0/1) 1 1  Attention: Serial 7 subtraction starting at 100 (0/3) 3 3  Language: Repeat phrase (0/2) 2 1  Language : Fluency (0/1) 1 1  Abstraction (0/2) 2 2  Delayed Recall (0/5) 5 5  Orientation (0/6) 6 6  Total 29 26   Cranial Nerves: PERRL, face symmetric, no dysarthria, hearing grossly intact Motor: moves all extremities equally Gait: normal-based.  IMPRESSION: 82 year old female who presents for follow up of lumbar radiculopathy and memory loss. She continues to score within the normal range on MOCA and is performing her ADLs independently. Discussed that there is no evidence of dementia at this time. Gabapentin remains helpful for her pain and does not cause side effects. Will continue this for now.  PLAN: -Continue gabapentin 1200/600/1200   Follow-up: 1 year,  or sooner if needed  I spent a total of 17 minutes on the date of the service. Discussed medication side effects, adverse reactions and drug interactions. Written educational materials and patient instructions outlining all of the above were given.  Stephanie Doyne, MD 11/25/22 10:56 AM

## 2022-12-30 ENCOUNTER — Encounter: Payer: Self-pay | Admitting: Cardiology

## 2022-12-31 ENCOUNTER — Encounter: Payer: Self-pay | Admitting: Cardiology

## 2022-12-31 ENCOUNTER — Ambulatory Visit: Payer: Medicare PPO | Attending: Cardiology | Admitting: Cardiology

## 2022-12-31 VITALS — BP 136/78 | HR 63 | Ht 60.0 in | Wt 154.6 lb

## 2022-12-31 DIAGNOSIS — M797 Fibromyalgia: Secondary | ICD-10-CM

## 2022-12-31 DIAGNOSIS — R9439 Abnormal result of other cardiovascular function study: Secondary | ICD-10-CM | POA: Diagnosis not present

## 2022-12-31 DIAGNOSIS — E785 Hyperlipidemia, unspecified: Secondary | ICD-10-CM

## 2022-12-31 NOTE — Patient Instructions (Signed)

## 2022-12-31 NOTE — Progress Notes (Signed)
Cardiology Office Note:    Date:  12/31/2022   ID:  Stephanie Macdonald, DOB May 13, 1940, MRN [id number]  PCP:  Wilmer Floor., MD  Cardiologist:  Gypsy Balsam, MD    Referring MD: Wilmer Floor., MD   Chief Complaint  Patient presents with   Follow-up    History of Present Illness:    Stephanie Macdonald is a 81 y.o. female medical history significant for calcification of the artery noted on the mammogram.  After that she got a stress test done which show ischemia in the LAD territory.  We had a long discussion after that what to do but since she was completely asymptomatic and exercising on the regular basis walking getting ready for trip to [place] we elected to pursue coronary CT angio.  Coronary CT angio showed calcium score 0 with minimal stenosis of LAD.  She is doing great she went to [place] had a good time was able to walk a lot with no difficulties sadly on the end of the trip she caught COVID.  Likely recovery still not up to the bar but overall doing well.  Denies have any chest pain tightness squeezing pressure burning chest  Past Medical History:  Diagnosis Date   Abnormal stress test 10/06/2022   Arthritis    rheumatoid   Benign hypertension    Coronary artery disease due to calcified coronary lesion 09/28/2022   Degenerative arthritis of thoracic spine 04/18/2013   Diabetes mellitus without complication (HCC)    Dyslipidemia 09/28/2022   Fibromyalgia    Hyperlipidemia    Lesion, thoracic root 04/18/2013   Overview:   IMPRESSION: Possible bilateral T6 or T7 atypical radiculopathy     Lumbar radiculopathy, acute    Muscle ache 04/18/2013   Myalgia 04/18/2013   Osteoarthritis of ankle and foot 09/28/2022   Osteopenia    Pain in the coccyx 11/13/2021   Pain in thoracic spine 04/18/2013   Thoracic and lumbosacral neuritis 04/18/2013   Overview:   IMPRESSION: left L5 probable 10-17-12      Past Surgical History:  Procedure Laterality Date   APPENDECTOMY      BREAST ENHANCEMENT SURGERY     CATARACT EXTRACTION Bilateral    HAND SURGERY     PLANTAR FASCIA SURGERY     skin lesions     TUBAL LIGATION      Current Medications: Current Meds  Medication Sig   ACETAMINOPHEN ER PO Take 1 tablet by mouth as needed (pain).   alendronate (FOSAMAX) 70 MG tablet Take 70 mg by mouth once a week. Take with a full glass of water on an empty stomach.   aspirin EC 81 MG tablet Take 1 tablet (81 mg total) by mouth daily. Swallow whole.   cyanocobalamin (VITAMIN B12) 1000 MCG tablet Take 3,000 mcg by mouth daily.   fesoterodine (TOVIAZ) 4 MG TB24 tablet Take 8 mg by mouth daily.   gabapentin (NEURONTIN) 600 MG tablet Take 1200 mg in the morning, 600 mg in the afternoon, and 1200 mg at night (Patient taking differently: Take 600 mg by mouth See admin instructions. Take 1200 mg in the morning, 600 mg in the afternoon, and 1200 mg at night)   losartan (COZAAR) 100 MG tablet Take 100 mg by mouth daily.   OVER THE COUNTER MEDICATION Take 3 tablets by mouth daily. Bone Strength Plant Base Calcium   OVER THE COUNTER MEDICATION Take 2 tablets by mouth 2 (two) times daily as needed (patient decides).   rosuvastatin (CRESTOR)  40 MG tablet Take 5 mg by mouth daily.   Current Facility-Administered Medications for the 12/31/22 encounter (Office Visit) with Georgeanna Lea, MD  Medication   triamcinolone acetonide (KENALOG) 10 MG/ML injection 10 mg     Allergies:   Codeine, Cyclobenzaprine, Cymbalta [duloxetine hcl], Diclofenac-misoprostol, Duloxetine, Hydrocodone, Hydrocodone-acetaminophen, Metaxalone, Other, Tramadol, and Venlafaxine   Social History   Socioeconomic History   Marital status: Widowed    Spouse name: Annette Stable   Number of children: 3   Years of education: Not on file   Highest education level: Bachelor's degree (e.g., BA, AB, BS)  Occupational History   Not on file  Tobacco Use   Smoking status: Never   Smokeless tobacco: Never  Substance and  Sexual Activity   Alcohol use: Never   Drug use: Never   Sexual activity: Not on file  Other Topics Concern   Not on file  Social History Narrative   Right handed    Some caffeine    Lives at home alone   Social Determinants of Health   Financial Resource Strain: Not on file  Food Insecurity: No Food Insecurity (11/17/2021)   Hunger Vital Sign    Worried About Running Out of Food in the Last Year: Never true    Ran Out of Food in the Last Year: Never true  Transportation Needs: No Transportation Needs (11/17/2021)   PRAPARE - Administrator, Civil Service (Medical): No    Lack of Transportation (Non-Medical): No  Physical Activity: Not on file  Stress: Not on file  Social Connections: Not on file     Family History: The patient's family history includes Cancer in her father; Colon cancer in her mother; Heart attack in her maternal grandfather and paternal grandfather; Hypertension in her mother; Skin cancer in her father. ROS:   Please see the history of present illness.    All 14 point review of systems negative except as described per history of present illness  EKGs/Labs/Other Studies Reviewed:         Recent Labs: 10/06/2022: BUN 20; Creatinine, Ser 0.90; Potassium 4.7; Sodium 144  Recent Lipid Panel No results found for: "CHOL", "TRIG", "HDL", "CHOLHDL", "VLDL", "LDLCALC", "LDLDIRECT"  Physical Exam:    VS:  BP 136/78 (BP Location: Left Arm, Patient Position: Sitting)   Pulse 63   Ht 5' (1.524 m)   Wt 154 lb 9.6 oz (70.1 kg)   LMP  (LMP Unknown)   SpO2 97%   BMI 30.19 kg/m     Wt Readings from Last 3 Encounters:  12/31/22 154 lb 9.6 oz (70.1 kg)  11/25/22 147 lb (66.7 kg)  10/06/22 138 lb 12.8 oz (63 kg)     GEN:  Well nourished, well developed in no acute distress HEENT: Normal NECK: No JVD; No carotid bruits LYMPHATICS: No lymphadenopathy CARDIAC: RRR, no murmurs, no rubs, no gallops RESPIRATORY:  Clear to auscultation without rales,  wheezing or rhonchi  ABDOMEN: Soft, non-tender, non-distended MUSCULOSKELETAL:  No edema; No deformity  SKIN: Warm and dry LOWER EXTREMITIES: no swelling NEUROLOGIC:  Alert and oriented x 3 PSYCHIATRIC:  Normal affect   ASSESSMENT:    1. Abnormal stress test   2. Dyslipidemia   3. Fibromyalgia    PLAN:    In order of problems listed above:  Close abnormal stress test.  Coronary CT angio showed only minimal disease of LAD.  No chest pain tightness squeezing pressure burning chest, the key will be risk factors modifications.  Her calcium score is 0.   Dyslipidemia I did review K PN LDL 127 HDL 87 she is on Crestor 40 we will get fasting lipid profile to reconfirm the finding. Fibromyalgia stable from that point review. Overall she is doing very well continue present management.   Medication Adjustments/Labs and Tests Ordered: Current medicines are reviewed at length with the patient today.  Concerns regarding medicines are outlined above.  No orders of the defined types were placed in this encounter.  Medication changes: No orders of the defined types were placed in this encounter.   Signed, Georgeanna Lea, MD, [facility] 12/31/2022 1:10 PM    [facility]

## 2023-01-07 DIAGNOSIS — N393 Stress incontinence (female) (male): Secondary | ICD-10-CM | POA: Diagnosis not present

## 2023-01-07 DIAGNOSIS — N952 Postmenopausal atrophic vaginitis: Secondary | ICD-10-CM | POA: Diagnosis not present

## 2023-01-07 DIAGNOSIS — N3281 Overactive bladder: Secondary | ICD-10-CM | POA: Diagnosis not present

## 2023-01-14 DIAGNOSIS — L821 Other seborrheic keratosis: Secondary | ICD-10-CM | POA: Diagnosis not present

## 2023-01-14 DIAGNOSIS — D485 Neoplasm of uncertain behavior of skin: Secondary | ICD-10-CM | POA: Diagnosis not present

## 2023-01-14 DIAGNOSIS — L814 Other melanin hyperpigmentation: Secondary | ICD-10-CM | POA: Diagnosis not present

## 2023-01-14 DIAGNOSIS — D225 Melanocytic nevi of trunk: Secondary | ICD-10-CM | POA: Diagnosis not present

## 2023-01-14 DIAGNOSIS — Z08 Encounter for follow-up examination after completed treatment for malignant neoplasm: Secondary | ICD-10-CM | POA: Diagnosis not present

## 2023-01-14 DIAGNOSIS — Z85828 Personal history of other malignant neoplasm of skin: Secondary | ICD-10-CM | POA: Diagnosis not present

## 2023-01-14 DIAGNOSIS — C44619 Basal cell carcinoma of skin of left upper limb, including shoulder: Secondary | ICD-10-CM | POA: Diagnosis not present

## 2023-01-18 DIAGNOSIS — N644 Mastodynia: Secondary | ICD-10-CM | POA: Diagnosis not present

## 2023-01-20 ENCOUNTER — Ambulatory Visit: Payer: Medicare PPO | Admitting: Psychiatry

## 2023-01-29 DIAGNOSIS — N644 Mastodynia: Secondary | ICD-10-CM | POA: Diagnosis not present

## 2023-02-02 DIAGNOSIS — C44619 Basal cell carcinoma of skin of left upper limb, including shoulder: Secondary | ICD-10-CM | POA: Diagnosis not present

## 2023-02-02 DIAGNOSIS — L905 Scar conditions and fibrosis of skin: Secondary | ICD-10-CM | POA: Diagnosis not present

## 2023-02-02 DIAGNOSIS — L728 Other follicular cysts of the skin and subcutaneous tissue: Secondary | ICD-10-CM | POA: Diagnosis not present

## 2023-02-09 DIAGNOSIS — M797 Fibromyalgia: Secondary | ICD-10-CM | POA: Diagnosis not present

## 2023-02-09 DIAGNOSIS — I10 Essential (primary) hypertension: Secondary | ICD-10-CM | POA: Diagnosis not present

## 2023-02-09 DIAGNOSIS — Z6828 Body mass index (BMI) 28.0-28.9, adult: Secondary | ICD-10-CM | POA: Diagnosis not present

## 2023-02-09 DIAGNOSIS — E785 Hyperlipidemia, unspecified: Secondary | ICD-10-CM | POA: Diagnosis not present

## 2023-02-09 DIAGNOSIS — F4381 Prolonged grief disorder: Secondary | ICD-10-CM | POA: Diagnosis not present

## 2023-04-21 DIAGNOSIS — Z6827 Body mass index (BMI) 27.0-27.9, adult: Secondary | ICD-10-CM | POA: Diagnosis not present

## 2023-04-21 DIAGNOSIS — M542 Cervicalgia: Secondary | ICD-10-CM | POA: Diagnosis not present

## 2023-06-23 DIAGNOSIS — N393 Stress incontinence (female) (male): Secondary | ICD-10-CM | POA: Diagnosis not present

## 2023-06-23 DIAGNOSIS — N952 Postmenopausal atrophic vaginitis: Secondary | ICD-10-CM | POA: Diagnosis not present

## 2023-06-23 DIAGNOSIS — N3281 Overactive bladder: Secondary | ICD-10-CM | POA: Diagnosis not present

## 2023-07-14 DIAGNOSIS — D225 Melanocytic nevi of trunk: Secondary | ICD-10-CM | POA: Diagnosis not present

## 2023-07-14 DIAGNOSIS — Z08 Encounter for follow-up examination after completed treatment for malignant neoplasm: Secondary | ICD-10-CM | POA: Diagnosis not present

## 2023-07-14 DIAGNOSIS — D485 Neoplasm of uncertain behavior of skin: Secondary | ICD-10-CM | POA: Diagnosis not present

## 2023-07-14 DIAGNOSIS — L821 Other seborrheic keratosis: Secondary | ICD-10-CM | POA: Diagnosis not present

## 2023-07-14 DIAGNOSIS — L814 Other melanin hyperpigmentation: Secondary | ICD-10-CM | POA: Diagnosis not present

## 2023-07-14 DIAGNOSIS — Z85828 Personal history of other malignant neoplasm of skin: Secondary | ICD-10-CM | POA: Diagnosis not present

## 2023-08-05 ENCOUNTER — Telehealth: Payer: Self-pay | Admitting: Cardiology

## 2023-08-05 NOTE — Telephone Encounter (Signed)
 Pt is having a mammogram done on Tuesday and wants to know if she needs to have another calcification test done, requesting cb to further discuss

## 2023-08-05 NOTE — Telephone Encounter (Signed)
 Advised it is recommended every 5 years. Pt verbalized understanding and had no additional questions.

## 2023-08-10 DIAGNOSIS — Z1231 Encounter for screening mammogram for malignant neoplasm of breast: Secondary | ICD-10-CM | POA: Diagnosis not present

## 2023-08-16 ENCOUNTER — Other Ambulatory Visit: Payer: Self-pay

## 2023-08-16 MED ORDER — GABAPENTIN 600 MG PO TABS: ORAL_TABLET | ORAL | 6 refills | Status: AC

## 2023-08-16 NOTE — Telephone Encounter (Signed)
 Requested Prescriptions   Pending Prescriptions Disp Refills   gabapentin  (NEURONTIN ) 600 MG tablet 150 tablet 6    Sig: Take 1200 mg in the morning, 600 mg in the afternoon, and 1200 mg at night   Last seen 11/25/22(Dr. Chima), next appt 11/30/23 (slack)  Last note from Dr. Billy Bue stated: PLAN: -Continue gabapentin  1200/600/1200 Dispenses   Dispensed Days Supply Quantity Provider Pharmacy  gabapentin  600 mg tablet 08/07/2023 90 405 tablet Alonso Jan., MD [facility]  GABAPENTIN  600MG  TABLETS 07/17/2023 30 150 each Dala Dublin, MD [facility]  GABAPENTIN  600MG  TABLETS 06/18/2023 30 150 each Dala Dublin, MD [facility]  GABAPENTIN  600MG  TABLETS 05/20/2023 30 150 each Dala Dublin, MD [facility]  GABAPENTIN  600MG  TABLETS 04/12/2023 30 150 each Dala Dublin, MD [facility]  GABAPENTIN  600MG  TABLETS 03/22/2023 30 150 each Dala Dublin, MD [facility]  GABAPENTIN  600MG  TABLETS 02/13/2023 30 150 each Dala Dublin, MD [facility]  GABAPENTIN  600MG  TABLETS 01/13/2023 30 150 each Dala Dublin, MD [facility]  GABAPENTIN  600MG  TABLETS 11/18/2022 30 135 each Alonso Jan., MD [facility]  GABAPENTIN  600MG  TABLETS 10/19/2022 30 135 each Alonso Jan., MD [facility]  GABAPENTIN  600MG  TABLETS 09/15/2022 30 135 each Alonso Jan., MD [facility]     Routing to work in

## 2023-09-02 DIAGNOSIS — E559 Vitamin D deficiency, unspecified: Secondary | ICD-10-CM | POA: Diagnosis not present

## 2023-09-02 DIAGNOSIS — F4381 Prolonged grief disorder: Secondary | ICD-10-CM | POA: Diagnosis not present

## 2023-09-02 DIAGNOSIS — Z6823 Body mass index (BMI) 23.0-23.9, adult: Secondary | ICD-10-CM | POA: Diagnosis not present

## 2023-09-02 DIAGNOSIS — M8580 Other specified disorders of bone density and structure, unspecified site: Secondary | ICD-10-CM | POA: Diagnosis not present

## 2023-09-02 DIAGNOSIS — E785 Hyperlipidemia, unspecified: Secondary | ICD-10-CM | POA: Diagnosis not present

## 2023-09-02 DIAGNOSIS — M797 Fibromyalgia: Secondary | ICD-10-CM | POA: Diagnosis not present

## 2023-09-02 DIAGNOSIS — E538 Deficiency of other specified B group vitamins: Secondary | ICD-10-CM | POA: Diagnosis not present

## 2023-09-02 DIAGNOSIS — I10 Essential (primary) hypertension: Secondary | ICD-10-CM | POA: Diagnosis not present

## 2023-10-09 ENCOUNTER — Other Ambulatory Visit: Payer: Self-pay | Admitting: Cardiology

## 2023-11-03 DIAGNOSIS — M546 Pain in thoracic spine: Secondary | ICD-10-CM | POA: Diagnosis not present

## 2023-11-03 DIAGNOSIS — G8929 Other chronic pain: Secondary | ICD-10-CM | POA: Diagnosis not present

## 2023-11-23 ENCOUNTER — Telehealth: Payer: Self-pay | Admitting: Psychiatry

## 2023-11-23 NOTE — Telephone Encounter (Signed)
 Patient cancelled appointment due to scheduling conflict. Will call back to reschedule if need to.

## 2023-11-24 DIAGNOSIS — M546 Pain in thoracic spine: Secondary | ICD-10-CM | POA: Diagnosis not present

## 2023-11-30 ENCOUNTER — Ambulatory Visit: Payer: Medicare PPO | Admitting: Neurology

## 2023-12-01 DIAGNOSIS — M546 Pain in thoracic spine: Secondary | ICD-10-CM | POA: Diagnosis not present

## 2023-12-01 DIAGNOSIS — G8929 Other chronic pain: Secondary | ICD-10-CM | POA: Diagnosis not present

## 2023-12-01 DIAGNOSIS — M47814 Spondylosis without myelopathy or radiculopathy, thoracic region: Secondary | ICD-10-CM | POA: Diagnosis not present

## 2023-12-09 ENCOUNTER — Other Ambulatory Visit: Payer: Self-pay

## 2023-12-09 DIAGNOSIS — I10 Essential (primary) hypertension: Secondary | ICD-10-CM | POA: Insufficient documentation

## 2023-12-09 DIAGNOSIS — E785 Hyperlipidemia, unspecified: Secondary | ICD-10-CM | POA: Insufficient documentation

## 2023-12-09 DIAGNOSIS — M8580 Other specified disorders of bone density and structure, unspecified site: Secondary | ICD-10-CM | POA: Insufficient documentation

## 2023-12-09 DIAGNOSIS — E119 Type 2 diabetes mellitus without complications: Secondary | ICD-10-CM | POA: Insufficient documentation

## 2023-12-09 DIAGNOSIS — M5416 Radiculopathy, lumbar region: Secondary | ICD-10-CM | POA: Insufficient documentation

## 2023-12-10 ENCOUNTER — Encounter: Payer: Self-pay | Admitting: Cardiology

## 2023-12-10 ENCOUNTER — Ambulatory Visit: Attending: Cardiology | Admitting: Cardiology

## 2023-12-10 VITALS — BP 130/82 | HR 56 | Ht 60.0 in | Wt 116.0 lb

## 2023-12-10 DIAGNOSIS — E785 Hyperlipidemia, unspecified: Secondary | ICD-10-CM | POA: Diagnosis not present

## 2023-12-10 DIAGNOSIS — E782 Mixed hyperlipidemia: Secondary | ICD-10-CM

## 2023-12-10 DIAGNOSIS — I10 Essential (primary) hypertension: Secondary | ICD-10-CM | POA: Diagnosis not present

## 2023-12-10 DIAGNOSIS — E119 Type 2 diabetes mellitus without complications: Secondary | ICD-10-CM | POA: Diagnosis not present

## 2023-12-10 NOTE — Patient Instructions (Signed)

## 2023-12-10 NOTE — Progress Notes (Signed)
 Cardiology Office Note:    Date:  12/10/2023   ID:  Stephanie Macdonald, DOB 23-May-1940, MRN [id number]  PCP:  Elaine Garnette BIRCH., MD  Cardiologist:  Lamar Fitch, MD    Referring MD: Elaine Garnette BIRCH., MD   No chief complaint on file. Doing well  History of Present Illness:    Stephanie Macdonald is a 83 y.o. female past medical history significant for abnormal stress test which was done after the coronary calcification has been discovered.  Stress tissue ischemia in LAD territory we had a long discussion thereafter we elected to proceed with coronary CT angio, coronary CT angio has been done coronary CT angio showed calcium score 0 with minimal stenosis of the LAD.  After that she is doing well.  Denies have any chest pain tightness squeezing pressure burning chest.  She is scheduled to travel to [place] he is not to treat about it.  Exercise on the regular basis at least 5 times a week 30 minutes, walking uphill with no symptoms, overall doing well  Past Medical History:  Diagnosis Date   Abnormal stress test 10/06/2022   Arthritis    rheumatoid   Benign hypertension    Degenerative arthritis of thoracic spine 04/18/2013   Diabetes mellitus without complication (HCC)    Dyslipidemia 09/28/2022   Fibromyalgia    Hyperlipidemia    Lesion, thoracic root 04/18/2013   Overview:   IMPRESSION: Possible bilateral T6 or T7 atypical radiculopathy     Lumbar radiculopathy, acute    Muscle ache 04/18/2013   Myalgia 04/18/2013   Osteoarthritis of ankle and foot 09/28/2022   Osteopenia    Pain in the coccyx 11/13/2021   Pain in thoracic spine 04/18/2013   Thoracic and lumbosacral neuritis 04/18/2013   Overview:   IMPRESSION: left L5 probable 10-17-12      Past Surgical History:  Procedure Laterality Date   APPENDECTOMY     BREAST ENHANCEMENT SURGERY     CATARACT EXTRACTION Bilateral    HAND SURGERY     PLANTAR FASCIA SURGERY     skin lesions     TUBAL LIGATION      Current  Medications: Current Meds  Medication Sig   ACETAMINOPHEN ER PO Take 1 tablet by mouth as needed (pain).   alendronate (FOSAMAX) 70 MG tablet Take 70 mg by mouth once a week. Take with a full glass of water on an empty stomach.   aspirin  EC (ASPIRIN  LOW DOSE) 81 MG tablet TAKE ONE TABLET BY MOUTH DAILY   fexofenadine (ALLEGRA) 60 MG tablet Take 120 mg by mouth daily.   gabapentin  (NEURONTIN ) 600 MG tablet Take 1200 mg in the morning, 600 mg in the afternoon, and 1200 mg at night   losartan (COZAAR) 100 MG tablet Take 100 mg by mouth daily.   OVER THE COUNTER MEDICATION Take 3 tablets by mouth daily. Bone Strength Plant Base Calcium   OVER THE COUNTER MEDICATION Take 2 tablets by mouth 2 (two) times daily as needed (patient decides).   rosuvastatin (CRESTOR) 40 MG tablet Take 5 mg by mouth daily.   Current Facility-Administered Medications for the 12/10/23 encounter (Office Visit) with Prosperity Darrough J, MD  Medication   triamcinolone  acetonide (KENALOG ) 10 MG/ML injection 10 mg     Allergies:   Codeine, Cyclobenzaprine, Cymbalta [duloxetine hcl], Diclofenac-misoprostol, Duloxetine, Hydrocodone, Hydrocodone-acetaminophen, Metaxalone, Other, Tramadol, and Venlafaxine   Social History   Socioeconomic History   Marital status: Widowed    Spouse name: Zell   Number  of children: 3   Years of education: Not on file   Highest education level: Bachelor's degree (e.g., BA, AB, BS)  Occupational History   Not on file  Tobacco Use   Smoking status: Never   Smokeless tobacco: Never  Substance and Sexual Activity   Alcohol use: Never   Drug use: Never   Sexual activity: Not on file  Other Topics Concern   Not on file  Social History Narrative   Right handed    Some caffeine    Lives at home alone   Social Drivers of Health   Financial Resource Strain: Not on file  Food Insecurity: No Food Insecurity (11/17/2021)   Hunger Vital Sign    Worried About Running Out of Food in the Last  Year: Never true    Ran Out of Food in the Last Year: Never true  Transportation Needs: No Transportation Needs (11/17/2021)   PRAPARE - Administrator, Civil Service (Medical): No    Lack of Transportation (Non-Medical): No  Physical Activity: Not on file  Stress: Not on file  Social Connections: Not on file     Family History: The patient's family history includes Cancer in her father; Colon cancer in her mother; Heart attack in her maternal grandfather and paternal grandfather; Hypertension in her mother; Skin cancer in her father. ROS:   Please see the history of present illness.    All 14 point review of systems negative except as described per history of present illness  EKGs/Labs/Other Studies Reviewed:    EKG Interpretation Date/Time:  Friday December 10 2023 11:04:42 EDT Ventricular Rate:  56 PR Interval:  130 QRS Duration:  72 QT Interval:  452 QTC Calculation: 436 R Axis:   105  Text Interpretation: Sinus bradycardia with Premature atrial complexes Rightward axis Borderline ECG When compared with ECG of 28-Sep-2022 10:35, Premature atrial complexes are now Present Confirmed by Bernie Charleston [phone]) on 12/10/2023 11:06:49 AM    Recent Labs: No results found for requested labs within last 365 days.  Recent Lipid Panel No results found for: CHOL, TRIG, HDL, CHOLHDL, VLDL, LDLCALC, LDLDIRECT  Physical Exam:    VS:  BP 130/82   Pulse (!) 56   Ht 5' (1.524 m)   Wt 116 lb (52.6 kg)   LMP  (LMP Unknown)   SpO2 95%   BMI 22.65 kg/m     Wt Readings from Last 3 Encounters:  12/10/23 116 lb (52.6 kg)  12/31/22 154 lb 9.6 oz (70.1 kg)  11/25/22 147 lb (66.7 kg)     GEN:  Well nourished, well developed in no acute distress HEENT: Normal NECK: No JVD; No carotid bruits LYMPHATICS: No lymphadenopathy CARDIAC: RRR, no murmurs, no rubs, no gallops RESPIRATORY:  Clear to auscultation without rales, wheezing or rhonchi  ABDOMEN: Soft,  non-tender, non-distended MUSCULOSKELETAL:  No edema; No deformity  SKIN: Warm and dry LOWER EXTREMITIES: no swelling NEUROLOGIC:  Alert and oriented x 3 PSYCHIATRIC:  Normal affect   ASSESSMENT:    1. Dyslipidemia   2. Benign hypertension   3. Diabetes mellitus without complication (HCC)   4. Mixed hyperlipidemia    PLAN:    In order of problems listed above:  Dyslipidemia I did review KPN which show me her LDL 97 HDL 85 decent control continue present management with Crestor 40. Essential hypertension blood pressure well-controlled continue present management. Coronary disease only mild disease stable no signs and symptoms of reactivation of the problem. Diabetes well-controlled  Medication Adjustments/Labs and Tests Ordered: Current medicines are reviewed at length with the patient today.  Concerns regarding medicines are outlined above.  Orders Placed This Encounter  Procedures   EKG 12-Lead   Medication changes: No orders of the defined types were placed in this encounter.   Signed, Lamar DOROTHA Fitch, MD, [facility] 12/10/2023 11:21 AM    [facility]

## 2024-01-03 DIAGNOSIS — N393 Stress incontinence (female) (male): Secondary | ICD-10-CM | POA: Diagnosis not present

## 2024-01-03 DIAGNOSIS — N952 Postmenopausal atrophic vaginitis: Secondary | ICD-10-CM | POA: Diagnosis not present

## 2024-01-03 DIAGNOSIS — N3281 Overactive bladder: Secondary | ICD-10-CM | POA: Diagnosis not present

## 2024-01-04 DIAGNOSIS — M47814 Spondylosis without myelopathy or radiculopathy, thoracic region: Secondary | ICD-10-CM | POA: Diagnosis not present

## 2024-01-10 DIAGNOSIS — Z961 Presence of intraocular lens: Secondary | ICD-10-CM | POA: Diagnosis not present

## 2024-01-19 DIAGNOSIS — Z789 Other specified health status: Secondary | ICD-10-CM | POA: Diagnosis not present

## 2024-01-19 DIAGNOSIS — Z08 Encounter for follow-up examination after completed treatment for malignant neoplasm: Secondary | ICD-10-CM | POA: Diagnosis not present

## 2024-01-19 DIAGNOSIS — R208 Other disturbances of skin sensation: Secondary | ICD-10-CM | POA: Diagnosis not present

## 2024-01-19 DIAGNOSIS — L814 Other melanin hyperpigmentation: Secondary | ICD-10-CM | POA: Diagnosis not present

## 2024-01-19 DIAGNOSIS — Z85828 Personal history of other malignant neoplasm of skin: Secondary | ICD-10-CM | POA: Diagnosis not present

## 2024-01-19 DIAGNOSIS — L821 Other seborrheic keratosis: Secondary | ICD-10-CM | POA: Diagnosis not present

## 2024-01-19 DIAGNOSIS — L820 Inflamed seborrheic keratosis: Secondary | ICD-10-CM | POA: Diagnosis not present

## 2024-01-19 DIAGNOSIS — L2989 Other pruritus: Secondary | ICD-10-CM | POA: Diagnosis not present

## 2024-01-19 DIAGNOSIS — D1801 Hemangioma of skin and subcutaneous tissue: Secondary | ICD-10-CM | POA: Diagnosis not present

## 2024-01-28 DIAGNOSIS — M47814 Spondylosis without myelopathy or radiculopathy, thoracic region: Secondary | ICD-10-CM | POA: Diagnosis not present

## 2024-02-17 DIAGNOSIS — M791 Myalgia, unspecified site: Secondary | ICD-10-CM | POA: Diagnosis not present
